# Patient Record
Sex: Female | Born: 1960 | Race: Black or African American | Hispanic: No | Marital: Married | Smoking: Never smoker
Health system: Southern US, Community
[De-identification: ages and names within clinical notes are randomized; demographics above are authoritative.]

## PROBLEM LIST (undated history)

## (undated) DIAGNOSIS — Z87440 Personal history of urinary (tract) infections: Secondary | ICD-10-CM

## (undated) DIAGNOSIS — I872 Venous insufficiency (chronic) (peripheral): Secondary | ICD-10-CM

## (undated) DIAGNOSIS — Z923 Personal history of irradiation: Secondary | ICD-10-CM

## (undated) DIAGNOSIS — Z8041 Family history of malignant neoplasm of ovary: Secondary | ICD-10-CM

## (undated) DIAGNOSIS — J329 Chronic sinusitis, unspecified: Secondary | ICD-10-CM

## (undated) DIAGNOSIS — R600 Localized edema: Secondary | ICD-10-CM

## (undated) DIAGNOSIS — G43909 Migraine, unspecified, not intractable, without status migrainosus: Secondary | ICD-10-CM

## (undated) DIAGNOSIS — C50919 Malignant neoplasm of unspecified site of unspecified female breast: Secondary | ICD-10-CM

## (undated) HISTORY — DX: Family history of malignant neoplasm of ovary: Z80.41

## (undated) HISTORY — DX: Personal history of urinary (tract) infections: Z87.440

## (undated) HISTORY — DX: Localized edema: R60.0

## (undated) HISTORY — DX: Venous insufficiency (chronic) (peripheral): I87.2

## (undated) HISTORY — DX: Chronic sinusitis, unspecified: J32.9

## (undated) HISTORY — DX: Migraine, unspecified, not intractable, without status migrainosus: G43.909

## (undated) HISTORY — DX: Malignant neoplasm of unspecified site of unspecified female breast: C50.919

## (undated) HISTORY — PX: BREAST BIOPSY: SHX20

---

## 1998-12-13 ENCOUNTER — Encounter: Admission: RE | Admit: 1998-12-13 | Discharge: 1999-01-31 | Payer: Self-pay | Admitting: Family Medicine

## 1998-12-13 ENCOUNTER — Encounter: Admission: RE | Admit: 1998-12-13 | Discharge: 1999-03-13 | Payer: Self-pay | Admitting: Family Medicine

## 2000-09-29 ENCOUNTER — Ambulatory Visit (HOSPITAL_COMMUNITY): Admission: RE | Admit: 2000-09-29 | Discharge: 2000-09-29 | Payer: Self-pay | Admitting: Family Medicine

## 2000-12-30 ENCOUNTER — Other Ambulatory Visit: Admission: RE | Admit: 2000-12-30 | Discharge: 2000-12-30 | Payer: Self-pay | Admitting: Obstetrics and Gynecology

## 2001-06-19 ENCOUNTER — Encounter: Payer: Self-pay | Admitting: Family Medicine

## 2001-06-19 ENCOUNTER — Ambulatory Visit: Admission: RE | Admit: 2001-06-19 | Discharge: 2001-06-19 | Payer: Self-pay | Admitting: Family Medicine

## 2004-05-18 ENCOUNTER — Ambulatory Visit (HOSPITAL_COMMUNITY): Admission: RE | Admit: 2004-05-18 | Discharge: 2004-05-18 | Payer: Self-pay | Admitting: Obstetrics and Gynecology

## 2006-04-04 ENCOUNTER — Ambulatory Visit (HOSPITAL_COMMUNITY): Admission: RE | Admit: 2006-04-04 | Discharge: 2006-04-04 | Payer: Self-pay | Admitting: General Surgery

## 2006-04-04 ENCOUNTER — Encounter (INDEPENDENT_AMBULATORY_CARE_PROVIDER_SITE_OTHER): Payer: Self-pay | Admitting: Specialist

## 2009-04-11 ENCOUNTER — Encounter (INDEPENDENT_AMBULATORY_CARE_PROVIDER_SITE_OTHER): Payer: Self-pay | Admitting: Obstetrics and Gynecology

## 2009-04-11 ENCOUNTER — Ambulatory Visit (HOSPITAL_COMMUNITY): Admission: RE | Admit: 2009-04-11 | Discharge: 2009-04-13 | Payer: Self-pay | Admitting: Obstetrics and Gynecology

## 2009-04-11 HISTORY — PX: ABDOMINAL HYSTERECTOMY: SHX81

## 2010-07-22 ENCOUNTER — Encounter: Payer: Self-pay | Admitting: Family Medicine

## 2010-10-04 LAB — CBC
HCT: 32.1 % — ABNORMAL LOW (ref 36.0–46.0)
HCT: 36.2 % (ref 36.0–46.0)
Hemoglobin: 12.1 g/dL (ref 12.0–15.0)
MCHC: 33.3 g/dL (ref 30.0–36.0)
MCV: 93.6 fL (ref 78.0–100.0)
MCV: 95 fL (ref 78.0–100.0)
Platelets: 229 10*3/uL (ref 150–400)
RBC: 3.43 MIL/uL — ABNORMAL LOW (ref 3.87–5.11)
RBC: 3.81 MIL/uL — ABNORMAL LOW (ref 3.87–5.11)
RDW: 12.4 % (ref 11.5–15.5)
WBC: 4.7 10*3/uL (ref 4.0–10.5)
WBC: 9.7 10*3/uL (ref 4.0–10.5)

## 2010-10-04 LAB — COMPREHENSIVE METABOLIC PANEL
ALT: 12 U/L (ref 0–35)
AST: 18 U/L (ref 0–37)
Albumin: 3.7 g/dL (ref 3.5–5.2)
Alkaline Phosphatase: 46 U/L (ref 39–117)
BUN: 4 mg/dL — ABNORMAL LOW (ref 6–23)
CO2: 27 mEq/L (ref 19–32)
Calcium: 8.8 mg/dL (ref 8.4–10.5)
Chloride: 108 mEq/L (ref 96–112)
Creatinine, Ser: 0.71 mg/dL (ref 0.4–1.2)
GFR calc Af Amer: >60 mL/min (ref >60)
GFR calc non Af Amer: >60 mL/min (ref >60)
Glucose, Bld: 90 mg/dL (ref 70–99)
Potassium: 3.6 mEq/L (ref 3.5–5.1)
Sodium: 138 mEq/L (ref 135–145)
Total Bilirubin: 0.5 mg/dL (ref 0.3–1.2)
Total Protein: 6.8 g/dL (ref 6.0–8.3)

## 2010-10-04 LAB — DIFFERENTIAL
Lymphocytes Relative: 32 % (ref 12–46)
Lymphs Abs: 1.5 10*3/uL (ref 0.7–4.0)
Monocytes Relative: 6 % (ref 3–12)
Neutro Abs: 2.8 10*3/uL (ref 1.7–7.7)
Neutrophils Relative %: 60 % (ref 43–77)

## 2010-11-16 NOTE — Op Note (Signed)
[name], Felicia Beasley              ACCOUNT NO.:  [id number]   MEDICAL RECORD NO.:  [id number]          PATIENT TYPE:  AMB   LOCATION:  DAY                          FACILITY:  [facility]   PHYSICIAN:  Timothy E. Earlene Plater, M.D. DATE OF BIRTH:  05-31-1961   DATE OF PROCEDURE:  04/04/2006  DATE OF DISCHARGE:                                 OPERATIVE REPORT   PREOPERATIVE DIAGNOSIS:  External hemorrhoids.   POSTOPERATIVE DIAGNOSIS:  External hemorrhoids.   PROCEDURE:  External hemorrhoidectomy.   SURGEON:  Timothy E. Earlene Plater, M.D.   ANESTHESIA:  General.   INDICATIONS FOR PROCEDURE:  [name] is an otherwise healthy 54, has had  long-term external hemorrhoids with pain, abrasion, burning, some bright red  bleeding and difficulty cleansing.  Negative GI history.  She had been seen,  counseled, examined and is ready for surgery.  She was seen, identified and  the permit signed.   DESCRIPTION OF PROCEDURE:  The patient was taken to the operating room and  placed supine.  General endotracheal anesthesia administered.  She was  placed in lithotomy.  Perianal area was inspected, prepped and draped in the  usual fashion.  A large complex external hemorrhoid was present posteriorly  with two internal components along the anoderm, one to the immediate right  of the posterior midline, one to the immediate left of the posterior  midline, and a confluent, single, large external tag covering roughly  between the 5 o'clock and 7 o'clock positions.  So to remove all the excess  tissue and the hemorrhoids I excised the two anodermal columns, first  radially and then excised the skin circumferentially.  I undermined the  edges, removed the superficial varicosities.  I closed each radial incision  with a radial running 3-0 chromic and then close the circumferential  incision with interrupted 3-0 chromic.Marland Kitchen  Bleeding was controlled.  The results were satisfactory.  The anus was not compromised and the  sphincter was left unmolested.  Inspection was accomplished.  Gelfoam gauze  dry sterile dressing.  Counts correct.  She tolerated it well, was removed  recovery room in good condition.   Written and verbal instructions were given her and her husband, along with  Percocet #[phone].  She will be followed as an outpatient.Sheppard Plumber. Earlene Plater, M.D.  Electronically Signed     TED/MEDQ  D:  04/04/2006  T:  04/06/2006  Job:  [id number]   cc:   Malachi Pro. Ambrose Mantle, M.D.  Fax: [phone]

## 2013-03-24 ENCOUNTER — Encounter: Payer: Self-pay | Admitting: Surgery

## 2013-03-24 ENCOUNTER — Other Ambulatory Visit: Payer: Self-pay | Admitting: *Deleted

## 2013-03-24 DIAGNOSIS — R609 Edema, unspecified: Secondary | ICD-10-CM

## 2013-04-26 ENCOUNTER — Encounter: Payer: BC Managed Care – PPO | Admitting: Surgery

## 2013-04-26 ENCOUNTER — Encounter (HOSPITAL_COMMUNITY): Payer: BC Managed Care – PPO

## 2019-01-27 ENCOUNTER — Other Ambulatory Visit: Payer: Self-pay | Admitting: Obstetrics and Gynecology

## 2019-01-27 DIAGNOSIS — N6310 Unspecified lump in the right breast, unspecified quadrant: Secondary | ICD-10-CM

## 2019-02-03 ENCOUNTER — Other Ambulatory Visit: Payer: Self-pay | Admitting: Obstetrics and Gynecology

## 2019-02-03 ENCOUNTER — Other Ambulatory Visit: Payer: Self-pay

## 2019-02-03 ENCOUNTER — Ambulatory Visit
Admission: RE | Admit: 2019-02-03 | Discharge: 2019-02-03 | Disposition: A | Discharge status: Home / Self Care | Payer: BC Managed Care – PPO | Source: Ambulatory Visit | Attending: Obstetrics and Gynecology | Admitting: Obstetrics and Gynecology

## 2019-02-03 DIAGNOSIS — N6310 Unspecified lump in the right breast, unspecified quadrant: Secondary | ICD-10-CM

## 2019-08-09 ENCOUNTER — Ambulatory Visit
Admission: RE | Admit: 2019-08-09 | Discharge: 2019-08-09 | Disposition: A | Discharge status: Home / Self Care | Payer: BC Managed Care – PPO | Source: Ambulatory Visit | Attending: Obstetrics and Gynecology | Admitting: Obstetrics and Gynecology

## 2019-08-09 ENCOUNTER — Other Ambulatory Visit: Payer: Self-pay | Admitting: Obstetrics and Gynecology

## 2019-08-09 ENCOUNTER — Other Ambulatory Visit: Payer: Self-pay

## 2019-08-09 DIAGNOSIS — N6310 Unspecified lump in the right breast, unspecified quadrant: Secondary | ICD-10-CM

## 2019-08-18 ENCOUNTER — Other Ambulatory Visit: Payer: Self-pay

## 2019-08-18 ENCOUNTER — Ambulatory Visit
Admission: RE | Admit: 2019-08-18 | Discharge: 2019-08-18 | Disposition: A | Discharge status: Home / Self Care | Payer: BC Managed Care – PPO | Source: Ambulatory Visit | Attending: Obstetrics and Gynecology | Admitting: Obstetrics and Gynecology

## 2019-08-18 DIAGNOSIS — N6310 Unspecified lump in the right breast, unspecified quadrant: Secondary | ICD-10-CM

## 2019-08-23 ENCOUNTER — Ambulatory Visit: Payer: Self-pay | Admitting: Surgery

## 2019-08-23 DIAGNOSIS — D0511 Intraductal carcinoma in situ of right breast: Secondary | ICD-10-CM

## 2019-08-23 NOTE — H&P (View-Only) (Signed)
Felicia Beasley Documented: 08/23/2019 2:35 PM Location: [facility] Patient #: [id number] DOB: [birth year] Married / Language: English / Race: Black or African American Female  History of Present Illness Marcello Moores A. Fadil Macmaster MD; 08/23/2019 3:58 PM) Patient words: Patient sent at the request of the [facility] [place] Dr. Claudie Revering for screening detected right breast mammographic abnormality. 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 There was an ultrasound correlates. She denies history of breast mass, nipple discharge or breast pain. No family history of breast cancer.                  ADDENDUM REPORT: 08/19/2019 16:04 ADDENDUM: Ultrasound of the right axilla was performed on 08/09/2019 and demonstrated no abnormal lymph nodes. Electronically Signed By: Claudie Revering M.D. On: 08/19/2019 16:04 Addended by Enrique Sack, MD on 08/19/2019 6:04 PM  Study Result CLINICAL DATA: Follow-up probably benign mass in the 10 o'clock position of the right breast. EXAM: DIGITAL DIAGNOSTIC RIGHT MAMMOGRAM WITH CAD AND TOMO ULTRASOUND RIGHT BREAST COMPARISON: Previous exam(s). ACR Breast Density Category b: There are scattered areas of fibroglandular density. FINDINGS: An oval asymmetry with ill-defined margins and some interspersed fat is again demonstrated in the posterior aspect of the outer right breast. This appears slightly more irregular today without significant change in size. There is also a suggestion of mild interval distortion associated with the asymmetry. No findings elsewhere in the right breast suspicious for malignancy. Mammographic images were processed with CAD. On physical exam, no mass is palpable in the upper outer right breast. Targeted ultrasound is performed, showing a 9 x 9 x 4 mm irregular, mildly hypoechoic area with interspersed increased  echogenicity in the 10 o'clock position of the right breast, 10 cm from the nipple, within an area of dense glandular tissue. This measured 9 x 8 x 4 mm 02/03/2019. The mildly hypoechoic areas have an echogenicity similar to adjacent fat lobules. IMPRESSION: Slightly more suspicious appearance of the mammographic asymmetry in the upper outer right breast. It is difficult to determine if the ultrasound finding in that area of the breast corresponds to the mammographic asymmetry. RECOMMENDATION: 3D stereotactic guided core needle biopsy of the mammographic asymmetry in the outer right breast. This has been discussed with the patient and scheduled at 11:30 a.m. on 08/18/2019. I have discussed the findings and recommendations with the patient. If applicable, a reminder letter will be sent to the patient regarding the next appointment. BI-RADS CATEGORY 4: Suspicious. Electronically Signed: By: Claudie Revering M.D. On: 08/09/2019 16:27                      Diagnosis Breast, right, needle core biopsy, outer - DUCTAL CARCINOMA IN SITU - SEE COMMENT.  The patient is a 59 year old female.   Past Surgical History Andreas Blower, [place]; 08/23/2019 2:40 PM) Breast Biopsy Right. Cesarean Section - 1 Hemorrhoidectomy Hysterectomy (not due to cancer) - Complete  Diagnostic Studies History (Armen Ferguson, CMA; 08/23/2019 2:40 PM) Colonoscopy 5-10 years ago Mammogram within last year Pap Smear 1-5 years ago  Allergies Andreas Blower, [place]; 08/23/2019 2:41 PM) Macrobid *URINARY ANTI-INFECTIVES*  Medication History Andreas Blower, [place]; 08/23/2019 2:41 PM) No Current Medications Medications Reconciled  Social History Andreas Blower, CMA; 08/23/2019 2:40 PM) Caffeine use Coffee. No alcohol use No drug use Tobacco use Never smoker.  Family History Andreas Blower, [place]; 08/23/2019 2:40 PM) Arthritis Father, Mother.  Diabetes Mellitus Mother. Ovarian Cancer  Sister.  Pregnancy / Birth History Andreas Blower, [place]; 08/23/2019 2:40 PM) Age at menarche 30 years. Age of menopause 44-55 Gravida 16 Maternal age 42-25 Para 3  Other Problems Andreas Blower, [place]; 08/23/2019 2:40 PM) Hemorrhoids Oophorectomy     Review of Systems (Armen Ferguson CMA; 08/23/2019 2:40 PM) General Not Present- Appetite Loss, Chills, Fatigue, Fever, Night Sweats, Weight Gain and Weight Loss. Skin Not Present- Change in Wart/Mole, Dryness, Hives, Jaundice, New Lesions, Non-Healing Wounds, Rash and Ulcer. HEENT Not Present- Earache, Hearing Loss, Hoarseness, Nose Bleed, Oral Ulcers, Ringing in the Ears, Seasonal Allergies, Sinus Pain, Sore Throat, Visual Disturbances, Wears glasses/contact lenses and Yellow Eyes. Respiratory Not Present- Bloody sputum, Chronic Cough, Difficulty Breathing, Snoring and Wheezing. Cardiovascular Not Present- Chest Pain, Difficulty Breathing Lying Down, Leg Cramps, Palpitations, Rapid Heart Rate, Shortness of Breath and Swelling of Extremities. Gastrointestinal Not Present- Abdominal Pain, Bloating, Bloody Stool, Change in Bowel Habits, Chronic diarrhea, Constipation, Difficulty Swallowing, Excessive gas, Gets full quickly at meals, Hemorrhoids, Indigestion, Nausea, Rectal Pain and Vomiting. Female Genitourinary Not Present- Frequency, Nocturia, Painful Urination, Pelvic Pain and Urgency. Neurological Not Present- Decreased Memory, Fainting, Headaches, Numbness, Seizures, Tingling, Tremor, Trouble walking and Weakness. Psychiatric Not Present- Anxiety, Bipolar, Change in Sleep Pattern, Depression, Fearful and Frequent crying. Endocrine Not Present- Cold Intolerance, Excessive Hunger, Hair Changes, Heat Intolerance, Hot flashes and New Diabetes. Hematology Not Present- Blood Thinners, Easy Bruising, Excessive bleeding, Gland problems, HIV and Persistent Infections.  Vitals (Armen Ferguson CMA; 08/23/2019 2:41 PM) 08/23/2019 2:41 PM Weight:  215.38 lb Height: 66in Body Surface Area: 2.06 m Body Mass Index: 34.76 kg/m  Temp.: 98.58F  Pulse: 84 (Regular)  P.OX: 98% (Room air) BP: 132/86 (Sitting, Left Arm, Standard)        Physical Exam (Kamry Faraci A. Tesa Meadors MD; 08/23/2019 3:58 PM)  General Mental Status-Alert. General Appearance-Consistent with stated age. Hydration-Well hydrated. Voice-Normal.  Chest and Lung Exam Note: cta NO sob  Breast Breast - Left-Symmetric, Non Tender, No Biopsy scars, no Dimpling - Left, No Inflammation, No Lumpectomy scars, No Mastectomy scars, No Peau d' Orange. Breast - Right-Symmetric, Non Tender, No Biopsy scars, no Dimpling - Right, No Inflammation, No Lumpectomy scars, No Mastectomy scars, No Peau d' Orange. Breast Lump-No Palpable Breast Mass. Note: Mild bruising right breast upper outer quadrant  Cardiovascular Note: NSR no JVD  Neurologic Neurologic evaluation reveals -alert and oriented x 3 with no impairment of recent or remote memory. Mental Status-Normal.  Lymphatic Head & Neck  General Head & Neck Lymphatics: Bilateral - Description - Normal. Axillary  General Axillary Region: Bilateral - Description - Normal. Tenderness - Non Tender.    Assessment & Plan (Sahra Converse A. Mckaila Duffus MD; 08/23/2019 4:03 PM)  BREAST NEOPLASM, TIS (DCIS), RIGHT (D05.11) Impression: Discussed breast conserving surgery versus mastectomy with reconstruction. Discussed the role of lymph node mapping which for DCIS is necessary at this point unless invasive disease is encountered. She has opted for right breast lumpectomy. Refer to medical and radiation oncology. Risk of lumpectomy include bleeding, infection, seroma, more surgery, use of seed/wire, wound care, cosmetic deformity and the need for other treatments, death , blood clots, death. Pt agrees to proceed.   TOTAL TIME 24 MINUTES  Face-to-face, Dr. dictation, examination, chart review, review of mammogram,  review pathology and documentation  Current Plans You are being scheduled for surgery- Our schedulers will call you.  You should hear from our office's scheduling department within 5 working days about the location,  date, and time of surgery. We try to make accommodations for patient's preferences in scheduling surgery, but sometimes the OR schedule or the surgeon's schedule prevents [place] from making those accommodations.  If you have not heard from our office [phone]) in 5 working days, call the office and ask for your surgeon's nurse.  If you have other questions about your diagnosis, plan, or surgery, call the office and ask for your surgeon's nurse.  We discussed the staging and pathophysiology of breast cancer. We discussed all of the different options for treatment for breast cancer including surgery, chemotherapy, radiation therapy, Herceptin, and antiestrogen therapy. We discussed a sentinel lymph node biopsy as she does not appear to having lymph node involvement right now. We discussed the performance of that with injection of radioactive tracer and blue dye. We discussed that she would have an incision underneath her axillary hairline. We discussed that there is a bout a 10-20% chance of having a positive node with a sentinel lymph node biopsy and we will await the permanent pathology to make any other first further decisions in terms of her treatment. One of these options might be to return to the operating room to perform an axillary lymph node dissection. We discussed about a 1-2% risk lifetime of chronic shoulder pain as well as lymphedema associated with a sentinel lymph node biopsy. We discussed the options for treatment of the breast cancer which included lumpectomy versus a mastectomy. We discussed the performance of the lumpectomy with a wire placement. We discussed a 10-20% chance of a positive margin requiring reexcision in the operating room. We also discussed that she may  need radiation therapy or antiestrogen therapy or both if she undergoes lumpectomy. We discussed the mastectomy and the postoperative care for that as well. We discussed that there is no difference in her survival whether she undergoes lumpectomy with radiation therapy or antiestrogen therapy versus a mastectomy. There is a slight difference in the local recurrence rate being 3-5% with lumpectomy and about 1% with a mastectomy. We discussed the risks of operation including bleeding, infection, possible reoperation. She understands her further therapy will be based on what her stages at the time of her operation.  Pt Education - [facility] breast cancer surgery: discussed with patient and provided information. Pt Education - [facility] Breast Biopsy HCI: discussed with patient and provided information. Pt Education - [facility] Breast Pains Education

## 2019-08-23 NOTE — H&P (Signed)
Nolon Nations Documented: 08/23/2019 2:35 PM Location: [facility] Patient #: [id number] DOB: 04-Nov-1960 Married / Language: English / Race: Black or African American Female  History of Present Illness Marcello Moores A. Lizzie Cokley MD; 08/23/2019 3:58 PM) Patient words: Patient sent at the request of the [facility] [place] Dr. Claudie Revering for screening detected right breast mammographic abnormality. 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 There was an ultrasound correlates. She denies history of breast mass, nipple discharge or breast pain. No family history of breast cancer.                  ADDENDUM REPORT: 08/19/2019 16:04 ADDENDUM: Ultrasound of the right axilla was performed on 08/09/2019 and demonstrated no abnormal lymph nodes. Electronically Signed By: Claudie Revering M.D. On: 08/19/2019 16:04 Addended by Enrique Sack, MD on 08/19/2019 6:04 PM  Study Result CLINICAL DATA: Follow-up probably benign mass in the 10 o'clock position of the right breast. EXAM: DIGITAL DIAGNOSTIC RIGHT MAMMOGRAM WITH CAD AND TOMO ULTRASOUND RIGHT BREAST COMPARISON: Previous exam(s). ACR Breast Density Category b: There are scattered areas of fibroglandular density. FINDINGS: An oval asymmetry with ill-defined margins and some interspersed fat is again demonstrated in the posterior aspect of the outer right breast. This appears slightly more irregular today without significant change in size. There is also a suggestion of mild interval distortion associated with the asymmetry. No findings elsewhere in the right breast suspicious for malignancy. Mammographic images were processed with CAD. On physical exam, no mass is palpable in the upper outer right breast. Targeted ultrasound is performed, showing a 9 x 9 x 4 mm irregular, mildly hypoechoic area with interspersed increased  echogenicity in the 10 o'clock position of the right breast, 10 cm from the nipple, within an area of dense glandular tissue. This measured 9 x 8 x 4 mm 02/03/2019. The mildly hypoechoic areas have an echogenicity similar to adjacent fat lobules. IMPRESSION: Slightly more suspicious appearance of the mammographic asymmetry in the upper outer right breast. It is difficult to determine if the ultrasound finding in that area of the breast corresponds to the mammographic asymmetry. RECOMMENDATION: 3D stereotactic guided core needle biopsy of the mammographic asymmetry in the outer right breast. This has been discussed with the patient and scheduled at 11:30 a.m. on 08/18/2019. I have discussed the findings and recommendations with the patient. If applicable, a reminder letter will be sent to the patient regarding the next appointment. BI-RADS CATEGORY 4: Suspicious. Electronically Signed: By: Claudie Revering M.D. On: 08/09/2019 16:27                      Diagnosis Breast, right, needle core biopsy, outer - DUCTAL CARCINOMA IN SITU - SEE COMMENT.  The patient is a 59 year old female.   Past Surgical History Andreas Blower, [place]; 08/23/2019 2:40 PM) Breast Biopsy Right. Cesarean Section - 1 Hemorrhoidectomy Hysterectomy (not due to cancer) - Complete  Diagnostic Studies History (Armen Ferguson, CMA; 08/23/2019 2:40 PM) Colonoscopy 5-10 years ago Mammogram within last year Pap Smear 1-5 years ago  Allergies Andreas Blower, [place]; 08/23/2019 2:41 PM) Macrobid *URINARY ANTI-INFECTIVES*  Medication History Andreas Blower, [place]; 08/23/2019 2:41 PM) No Current Medications Medications Reconciled  Social History Andreas Blower, CMA; 08/23/2019 2:40 PM) Caffeine use Coffee. No alcohol use No drug use Tobacco use Never smoker.  Family History Andreas Blower, [place]; 08/23/2019 2:40 PM) Arthritis Father, Mother.  Diabetes Mellitus Mother. Ovarian Cancer  Sister.  Pregnancy / Birth History Andreas Blower, [place]; 08/23/2019 2:40 PM) Age at menarche 36 years. Age of menopause 63-55 Gravida 96 Maternal age 68-25 Para 3  Other Problems Andreas Blower, [place]; 08/23/2019 2:40 PM) Hemorrhoids Oophorectomy     Review of Systems (Armen Ferguson CMA; 08/23/2019 2:40 PM) General Not Present- Appetite Loss, Chills, Fatigue, Fever, Night Sweats, Weight Gain and Weight Loss. Skin Not Present- Change in Wart/Mole, Dryness, Hives, Jaundice, New Lesions, Non-Healing Wounds, Rash and Ulcer. HEENT Not Present- Earache, Hearing Loss, Hoarseness, Nose Bleed, Oral Ulcers, Ringing in the Ears, Seasonal Allergies, Sinus Pain, Sore Throat, Visual Disturbances, Wears glasses/contact lenses and Yellow Eyes. Respiratory Not Present- Bloody sputum, Chronic Cough, Difficulty Breathing, Snoring and Wheezing. Cardiovascular Not Present- Chest Pain, Difficulty Breathing Lying Down, Leg Cramps, Palpitations, Rapid Heart Rate, Shortness of Breath and Swelling of Extremities. Gastrointestinal Not Present- Abdominal Pain, Bloating, Bloody Stool, Change in Bowel Habits, Chronic diarrhea, Constipation, Difficulty Swallowing, Excessive gas, Gets full quickly at meals, Hemorrhoids, Indigestion, Nausea, Rectal Pain and Vomiting. Female Genitourinary Not Present- Frequency, Nocturia, Painful Urination, Pelvic Pain and Urgency. Neurological Not Present- Decreased Memory, Fainting, Headaches, Numbness, Seizures, Tingling, Tremor, Trouble walking and Weakness. Psychiatric Not Present- Anxiety, Bipolar, Change in Sleep Pattern, Depression, Fearful and Frequent crying. Endocrine Not Present- Cold Intolerance, Excessive Hunger, Hair Changes, Heat Intolerance, Hot flashes and New Diabetes. Hematology Not Present- Blood Thinners, Easy Bruising, Excessive bleeding, Gland problems, HIV and Persistent Infections.  Vitals (Armen Ferguson CMA; 08/23/2019 2:41 PM) 08/23/2019 2:41 PM Weight:  215.38 lb Height: 66in Body Surface Area: 2.06 m Body Mass Index: 34.76 kg/m  Temp.: 98.41F  Pulse: 84 (Regular)  P.OX: 98% (Room air) BP: 132/86 (Sitting, Left Arm, Standard)        Physical Exam (Jasraj Lappe A. Mikahla Wisor MD; 08/23/2019 3:58 PM)  General Mental Status-Alert. General Appearance-Consistent with stated age. Hydration-Well hydrated. Voice-Normal.  Chest and Lung Exam Note: cta NO sob  Breast Breast - Left-Symmetric, Non Tender, No Biopsy scars, no Dimpling - Left, No Inflammation, No Lumpectomy scars, No Mastectomy scars, No Peau d' Orange. Breast - Right-Symmetric, Non Tender, No Biopsy scars, no Dimpling - Right, No Inflammation, No Lumpectomy scars, No Mastectomy scars, No Peau d' Orange. Breast Lump-No Palpable Breast Mass. Note: Mild bruising right breast upper outer quadrant  Cardiovascular Note: NSR no JVD  Neurologic Neurologic evaluation reveals -alert and oriented x 3 with no impairment of recent or remote memory. Mental Status-Normal.  Lymphatic Head & Neck  General Head & Neck Lymphatics: Bilateral - Description - Normal. Axillary  General Axillary Region: Bilateral - Description - Normal. Tenderness - Non Tender.    Assessment & Plan (Terrell Ostrand A. Kendrell Lottman MD; 08/23/2019 4:03 PM)  BREAST NEOPLASM, TIS (DCIS), RIGHT (D05.11) Impression: Discussed breast conserving surgery versus mastectomy with reconstruction. Discussed the role of lymph node mapping which for DCIS is necessary at this point unless invasive disease is encountered. She has opted for right breast lumpectomy. Refer to medical and radiation oncology. Risk of lumpectomy include bleeding, infection, seroma, more surgery, use of seed/wire, wound care, cosmetic deformity and the need for other treatments, death , blood clots, death. Pt agrees to proceed.   TOTAL TIME 93 MINUTES  Face-to-face, Dr. dictation, examination, chart review, review of mammogram,  review pathology and documentation  Current Plans You are being scheduled for surgery- Our schedulers will call you.  You should hear from our office's scheduling department within 5 working days about the location,  date, and time of surgery. We try to make accommodations for patient's preferences in scheduling surgery, but sometimes the OR schedule or the surgeon's schedule prevents [place] from making those accommodations.  If you have not heard from our office [phone]) in 5 working days, call the office and ask for your surgeon's nurse.  If you have other questions about your diagnosis, plan, or surgery, call the office and ask for your surgeon's nurse.  We discussed the staging and pathophysiology of breast cancer. We discussed all of the different options for treatment for breast cancer including surgery, chemotherapy, radiation therapy, Herceptin, and antiestrogen therapy. We discussed a sentinel lymph node biopsy as she does not appear to having lymph node involvement right now. We discussed the performance of that with injection of radioactive tracer and blue dye. We discussed that she would have an incision underneath her axillary hairline. We discussed that there is a bout a 10-20% chance of having a positive node with a sentinel lymph node biopsy and we will await the permanent pathology to make any other first further decisions in terms of her treatment. One of these options might be to return to the operating room to perform an axillary lymph node dissection. We discussed about a 1-2% risk lifetime of chronic shoulder pain as well as lymphedema associated with a sentinel lymph node biopsy. We discussed the options for treatment of the breast cancer which included lumpectomy versus a mastectomy. We discussed the performance of the lumpectomy with a wire placement. We discussed a 10-20% chance of a positive margin requiring reexcision in the operating room. We also discussed that she may  need radiation therapy or antiestrogen therapy or both if she undergoes lumpectomy. We discussed the mastectomy and the postoperative care for that as well. We discussed that there is no difference in her survival whether she undergoes lumpectomy with radiation therapy or antiestrogen therapy versus a mastectomy. There is a slight difference in the local recurrence rate being 3-5% with lumpectomy and about 1% with a mastectomy. We discussed the risks of operation including bleeding, infection, possible reoperation. She understands her further therapy will be based on what her stages at the time of her operation.  Pt Education - [facility]b breast cancer surgery: discussed with patient and provided information. Pt Education - [facility] Breast Biopsy HCI: discussed with patient and provided information. Pt Education - [facility] Breast Pains Education

## 2019-08-24 ENCOUNTER — Telehealth: Payer: Self-pay | Admitting: Oncology

## 2019-08-24 NOTE — Telephone Encounter (Signed)
Received a new pt referral from Dr. Brantley Stage at [facility] for a dx of breast cancer. Pt has been cld and scheduled to see Dr. Jana Hakim on 3/2 at 4pm w/labs at 3:30pm. Pt aware to arrive 15 minutes early.

## 2019-08-25 ENCOUNTER — Encounter: Payer: Self-pay | Admitting: *Deleted

## 2019-08-25 ENCOUNTER — Encounter: Payer: Self-pay | Admitting: Adult Health

## 2019-08-25 DIAGNOSIS — Z170 Estrogen receptor positive status [ER+]: Secondary | ICD-10-CM | POA: Insufficient documentation

## 2019-08-25 DIAGNOSIS — C50411 Malignant neoplasm of upper-outer quadrant of right female breast: Secondary | ICD-10-CM | POA: Insufficient documentation

## 2019-08-26 ENCOUNTER — Other Ambulatory Visit: Payer: Self-pay | Admitting: Surgery

## 2019-08-26 DIAGNOSIS — D0511 Intraductal carcinoma in situ of right breast: Secondary | ICD-10-CM

## 2019-08-30 ENCOUNTER — Encounter: Payer: Self-pay | Admitting: Oncology

## 2019-08-30 ENCOUNTER — Other Ambulatory Visit: Payer: Self-pay

## 2019-08-30 DIAGNOSIS — Z170 Estrogen receptor positive status [ER+]: Secondary | ICD-10-CM

## 2019-08-30 DIAGNOSIS — C50411 Malignant neoplasm of upper-outer quadrant of right female breast: Secondary | ICD-10-CM

## 2019-08-30 NOTE — Progress Notes (Signed)
[place]  Telephone:(336) [phone] Fax:(336) [phone]     ID: Felicia Beasley DOB: [birth year]  MR#: [id number]  VEH#:[id number]  Patient Care Team: Harlan Stains, MD as PCP - General (Family Medicine) Mauro Kaufmann, RN as Oncology Nurse Navigator Rockwell Germany, RN as Oncology Nurse Navigator Erroll Luna, MD as Consulting Physician (General Surgery) Idrissa Beville, Virgie Dad, MD as Consulting Physician (Oncology) Newton Pigg, MD as Consulting Physician (Obstetrics and Gynecology) Gery Pray, MD as Consulting Physician (Radiation Oncology) Clarene Essex, Counselor (Genetic Counselor) Chauncey Cruel, MD OTHER MD:  CHIEF COMPLAINT: noninvasive breast cancer  CURRENT TREATMENT: awaiting definitive surgery   HISTORY OF CURRENT ILLNESS: Felicia Beasley had routine screening mammography on 01/25/2019 showing a possible abnormality in the right breast. She underwent right diagnostic mammography with tomography and right breast ultrasonography at [facility] on 02/03/2019 showing: breast density category B; probably benign 0.9 cm mass at 10 o'clock. Repeat study was recommended in 6 months.   She returned on 08/09/2019 for repeat right diagnostic mammogram and right breast ultrasound showing: breast density category B; slightly more suspicious appearance of 0.9 cm mass at 10 o'clock, difficult to determine if this corresponds to mammographic asymmetry.  Accordingly on 08/18/2019 she proceeded to biopsy of the right breast area in question. The pathology from this procedure ([id number]) showed: ductal carcinoma in situ, intermediate grade, with necrosis. Prognostic indicators significant for: estrogen receptor, 100% positive with strong staining intensity and progesterone receptor, 100% positive with moderate staining intensity.  She is scheduled for right lumpectomy on 09/08/2019 under Dr. Brantley Stage.  The patient's subsequent history is as detailed  below.   INTERVAL HISTORY: Felicia Beasley was evaluated in the breast cancer clinic on 08/31/2019. Her case was also presented at the multidisciplinary breast cancer conference on 08/25/2019. At that time a preliminary plan was proposed: Breast conserving surgery, adjuvant radiation, antiestrogens.   REVIEW OF SYSTEMS: There were no specific symptoms leading to the original mammogram, which was routinely scheduled. The patient denies unusual headaches, visual changes, nausea, vomiting, stiff neck, dizziness, or gait imbalance. There has been no cough, phlegm production, or pleurisy, no chest pain or pressure, and no change in bowel or bladder habits. The patient denies fever, rash, bleeding, unexplained fatigue or unexplained weight loss. A detailed review of systems was otherwise entirely negative.   PAST MEDICAL HISTORY: Past Medical History:  Diagnosis Date  . Breast cancer ([place])   . Edema leg   . History of recurrent UTIs   . Migraine headache   . Sinusitis   . Venous insufficiency     PAST SURGICAL HISTORY: Past Surgical History:  Procedure Laterality Date  . ABDOMINAL HYSTERECTOMY Bilateral 04/11/2009   w/ BSO; for menorrhagia and fibroids  . CESAREAN SECTION      FAMILY HISTORY: Family History  Problem Relation Age of Onset  . Arthritis Mother   . Diabetes Mother   . Arthritis Father   . Ovarian cancer Sister    [name] is 10 years old and her mother is 17 years old as of March 2021.  The patient has 2 brothers and 2 sisters, all half siblings.  One sister had ovarian cancer diagnosed at age 27.  There is no other history of cancer in the family to the patient's knowledge  GYNECOLOGIC HISTORY:  Patient's last menstrual period was 04/11/2009 (exact date). Menarche: 59 years old Age at first live birth: 59 years old [place] P 3 LMP 2010 Contraceptive more than  1 year, no complications HRT no  Hysterectomy? Yes, for menorrhagia and fibroids BSO? 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  Her husband running manages a [employer] location and is Company secretary of the [employer] which the patient attends.  Daughter Janett Billow 43 in [place] works for the [employer] son Corene Cornea 61 in [place] works for [employer] and is very much involved in Geologist, engineering.  Daughter Anderson Malta 83 in [place] is a homemaker.  The patient has 2 grandchildren and a third 1 due August 2021.    ADVANCED DIRECTIVES: In the absence of documents to the contrary the patient's husband is her healthcare power of attorney   HEALTH MAINTENANCE: Social History   Tobacco Use  . Smoking status: Never Smoker  . Smokeless tobacco: Never Used  Substance Use Topics  . Alcohol use: No  . Drug use: No     Colonoscopy: 2010/[place]  PAP: Per Dr. Ulanda Edison  Bone density: none on file   Allergies  Allergen Reactions  . Ciprocinonide [Fluocinolone]     nausea  . Macrobid [Nitrofurantoin Macrocrystal]     migraine    Current Outpatient Medications  Medication Sig Dispense Refill  . Ascorbic Acid (VITAMIN C) 100 MG tablet Take 100 mg by mouth daily.    Marland Kitchen aspirin EC 81 MG tablet Take 81 mg by mouth daily.    . Cranberry 1000 MG CAPS Take by mouth.    . Turmeric 400 MG CAPS Take by mouth.    . Omega-3 Fatty Acids (FISH OIL) 1000 MG CAPS Fish Oil     No current facility-administered medications for this visit.    OBJECTIVE: Middle-aged African-American woman in no acute distress  Vitals:   08/31/19 1545  BP: 135/76  Pulse: 74  Resp: 18  Temp: 98.3 F (36.8 C)  SpO2: 100%     Body mass index is 34.14 kg/m.   Wt Readings from Last 3 Encounters:  08/31/19 211 lb 8 oz (95.9 kg)      ECOG FS:1 - Symptomatic but completely ambulatory  Ocular: Sclerae unicteric, pupils round and equal Ear-nose-throat: Wearing a mask Lymphatic: No cervical or supraclavicular adenopathy Lungs no rales or rhonchi Heart  regular rate and rhythm Abd soft, nontender, positive bowel sounds MSK no focal spinal tenderness, no joint edema Neuro: non-focal, well-oriented, appropriate affect Breasts: The right breast is status post recent biopsy.  There is no palpable mass and no skin or nipple changes of concern.  The left breast is benign.  Both axillae are benign.   LAB RESULTS:  CMP     Component Value Date/Time   NA 142 08/31/2019 1526   K 4.0 08/31/2019 1526   CL 106 08/31/2019 1526   CO2 28 08/31/2019 1526   GLUCOSE 92 08/31/2019 1526   BUN 13 08/31/2019 1526   CREATININE 0.87 08/31/2019 1526   CALCIUM 8.9 08/31/2019 1526   PROT 7.8 08/31/2019 1526   ALBUMIN 3.9 08/31/2019 1526   AST 14 (L) 08/31/2019 1526   ALT 13 08/31/2019 1526   ALKPHOS 71 08/31/2019 1526   BILITOT 0.2 (L) 08/31/2019 1526   GFRNONAA >60 08/31/2019 1526   GFRAA >60 08/31/2019 1526    No results found for: TOTALPROTELP, ALBUMINELP, A1GS, A2GS, BETS, BETA2SER, GAMS, MSPIKE, SPEI  Lab Results  Component Value Date   WBC 8.0 08/31/2019   NEUTROABS 4.9 08/31/2019   HGB 12.1 08/31/2019   HCT 38.4 08/31/2019  MCV 93.2 08/31/2019   PLT 257 08/31/2019    No results found for: LABCA2  No components found for: TKPTWS568  No results for input(s): INR in the last 168 hours.  No results found for: LABCA2  No results found for: LEX517  No results found for: GYF749  No results found for: SWH675  No results found for: CA2729  No components found for: HGQUANT  No results found for: CEA1 / No results found for: CEA1   No results found for: AFPTUMOR  No results found for: CHROMOGRNA  No results found for: KPAFRELGTCHN, LAMBDASER, KAPLAMBRATIO (kappa/lambda light chains)  No results found for: HGBA, HGBA2QUANT, HGBFQUANT, HGBSQUAN (Hemoglobinopathy evaluation)   No results found for: LDH  No results found for: IRON, TIBC, IRONPCTSAT (Iron and TIBC)  No results found for: FERRITIN  Urinalysis No results  found for: COLORURINE, APPEARANCEUR, LABSPEC, PHURINE, GLUCOSEU, HGBUR, BILIRUBINUR, KETONESUR, PROTEINUR, UROBILINOGEN, NITRITE, LEUKOCYTESUR   STUDIES: US BREAST LTD UNI RIGHT INC AXILLA  Addendum Date: 08/19/2019   ADDENDUM REPORT: 08/19/2019 16:04 ADDENDUM: Ultrasound of the right axilla was performed on 08/09/2019 and demonstrated no abnormal lymph nodes. Electronically Signed   By: Claudie Revering M.D.   On: 08/19/2019 16:04   Result Date: 08/19/2019 CLINICAL DATA:  Follow-up probably benign mass in the 10 o'clock position of the right breast. EXAM: DIGITAL DIAGNOSTIC RIGHT MAMMOGRAM WITH CAD AND TOMO ULTRASOUND RIGHT BREAST COMPARISON:  Previous exam(s). ACR Breast Density Category b: There are scattered areas of fibroglandular density. FINDINGS: An oval asymmetry with ill-defined margins and some interspersed fat is again demonstrated in the posterior aspect of the outer right breast. This appears slightly more irregular today without significant change in size. There is also a suggestion of mild interval distortion associated with the asymmetry. No findings elsewhere in the right breast suspicious for malignancy. Mammographic images were processed with CAD. On physical exam, no mass is palpable in the upper outer right breast. Targeted ultrasound is performed, showing a 9 x 9 x 4 mm irregular, mildly hypoechoic area with interspersed increased echogenicity in the 10 o'clock position of the right breast, 10 cm from the nipple, within an area of dense glandular tissue. This measured 9 x 8 x 4 mm 02/03/2019. The mildly hypoechoic areas have an echogenicity similar to adjacent fat lobules. IMPRESSION: Slightly more suspicious appearance of the mammographic asymmetry in the upper outer right breast. It is difficult to determine if the ultrasound finding in that area of the breast corresponds to the mammographic asymmetry. RECOMMENDATION: 3D stereotactic guided core needle biopsy of the mammographic  asymmetry in the outer right breast. This has been discussed with the patient and scheduled at 11:30 a.m. on 08/18/2019. I have discussed the findings and recommendations with the patient. If applicable, a reminder letter will be sent to the patient regarding the next appointment. BI-RADS CATEGORY  4: Suspicious. Electronically Signed: By: Claudie Revering M.D. On: 08/09/2019 16:27   MM DIAG BREAST TOMO UNI RIGHT  Addendum Date: 08/19/2019   ADDENDUM REPORT: 08/19/2019 16:04 ADDENDUM: Ultrasound of the right axilla was performed on 08/09/2019 and demonstrated no abnormal lymph nodes. Electronically Signed   By: Claudie Revering M.D.   On: 08/19/2019 16:04   Result Date: 08/19/2019 CLINICAL DATA:  Follow-up probably benign mass in the 10 o'clock position of the right breast. EXAM: DIGITAL DIAGNOSTIC RIGHT MAMMOGRAM WITH CAD AND TOMO ULTRASOUND RIGHT BREAST COMPARISON:  Previous exam(s). ACR Breast Density Category b: There are scattered areas of fibroglandular density. FINDINGS:  An oval asymmetry with ill-defined margins and some interspersed fat is again demonstrated in the posterior aspect of the outer right breast. This appears slightly more irregular today without significant change in size. There is also a suggestion of mild interval distortion associated with the asymmetry. No findings elsewhere in the right breast suspicious for malignancy. Mammographic images were processed with CAD. On physical exam, no mass is palpable in the upper outer right breast. Targeted ultrasound is performed, showing a 9 x 9 x 4 mm irregular, mildly hypoechoic area with interspersed increased echogenicity in the 10 o'clock position of the right breast, 10 cm from the nipple, within an area of dense glandular tissue. This measured 9 x 8 x 4 mm 02/03/2019. The mildly hypoechoic areas have an echogenicity similar to adjacent fat lobules. IMPRESSION: Slightly more suspicious appearance of the mammographic asymmetry in the upper outer  right breast. It is difficult to determine if the ultrasound finding in that area of the breast corresponds to the mammographic asymmetry. RECOMMENDATION: 3D stereotactic guided core needle biopsy of the mammographic asymmetry in the outer right breast. This has been discussed with the patient and scheduled at 11:30 a.m. on 08/18/2019. I have discussed the findings and recommendations with the patient. If applicable, a reminder letter will be sent to the patient regarding the next appointment. BI-RADS CATEGORY  4: Suspicious. Electronically Signed: By: Claudie Revering M.D. On: 08/09/2019 16:27   MM CLIP PLACEMENT RIGHT  Result Date: 08/18/2019 CLINICAL DATA:  Stereotactic biopsy was performed of an asymmetry in the upper outer right breast. EXAM: DIAGNOSTIC RIGHT MAMMOGRAM POST STEREOTACTIC BIOPSY COMPARISON:  Previous exam(s). FINDINGS: Mammographic images were obtained following stereotactic guided biopsy of the right breast. The biopsy marking clip is in expected position at the site of biopsy. IMPRESSION: Appropriate positioning of the coil shaped biopsy marking clip at the site of biopsy in the upper outer quadrant, posterior third. Final Assessment: Post Procedure Mammograms for Marker Placement Electronically Signed   By: Curlene Dolphin M.D.   On: 08/18/2019 12:06   MM RT BREAST BX W LOC DEV 1ST LESION IMAGE BX SPEC STEREO GUIDE  Addendum Date: 08/19/2019   ADDENDUM REPORT: 08/19/2019 13:43 ADDENDUM: Pathology revealed INTERMEDIATE GRADE DUCTAL CARCINOMA IN SITU WITH NECROSIS of the Right breast, outer. This was found to be concordant by Dr. Curlene Dolphin. Pathology results were discussed with the patient by telephone. The patient reported doing well after the biopsy with tenderness at the site. Post biopsy instructions and care were reviewed and questions were answered. The patient was encouraged to call [facility] for any additional concerns. Surgical consultation has been  arranged with Dr. Erroll Luna at [facility] on August 23, 2019. Pathology results reported by Terie Purser, RN on 08/19/2019. Electronically Signed   By: Curlene Dolphin M.D.   On: 08/19/2019 13:43   Result Date: 08/19/2019 CLINICAL DATA:  Stereotactic biopsy was recommended of an asymmetry in the outer right breast. EXAM: RIGHT BREAST STEREOTACTIC CORE NEEDLE BIOPSY COMPARISON:  Previous exams. FINDINGS: The patient and I discussed the procedure of stereotactic-guided biopsy including benefits and alternatives. We discussed the high likelihood of a successful procedure. We discussed the risks of the procedure including infection, bleeding, tissue injury, clip migration, and inadequate sampling. Informed written consent was given. The usual time out protocol was performed immediately prior to the procedure. Using sterile technique and 1% Lidocaine as local anesthetic, under stereotactic guidance, a 9 gauge vacuum assisted device was  used to perform core needle biopsy of asymmetry in the outer right breast using a superior approach. Specimen radiograph was performed showing tissue. Lesion quadrant: Upper outer quadrant At the conclusion of the procedure, coil tissue marker clip was deployed into the biopsy cavity. Follow-up 2-view mammogram was performed and dictated separately. IMPRESSION: Stereotactic-guided biopsy of the right breast. No apparent complications. Electronically Signed: By: Curlene Dolphin M.D. On: 08/18/2019 12:07     ELIGIBLE FOR AVAILABLE RESEARCH PROTOCOL: not a COMET candidate (mass on imaging)  ASSESSMENT: 59 y.o. [place] woman status post right breast upper outer quadrant biopsy 08/18/2019 showing ductal carcinoma in situ, grade 2, estrogen and progesterone receptor positive.  (1) definitive surgery pending  (2) adjuvant radiation as appropriate  (3) genetics testing  (4) antiestrogens   PLAN: I met today with Felicia Beasley to review her new  diagnosis. Specifically we discussed the biology of her breast cancer, its diagnosis, staging, treatment  options and prognosis. Felicia Beasley understands that in noninvasive ductal carcinoma, also called ductal carcinoma in situ ("DCIS") the breast cancer cells remain trapped in the ducts were they started. They cannot travel to a vital organ. For that reason these cancers in themselves are not life-threatening.  If the whole breast is removed then all the ducts are removed and since the cancer cells are trapped in the ducts, the cure rate with mastectomy for noninvasive breast cancer is approximately 99%. Nevertheless we recommend lumpectomy, because there is no survival advantage to mastectomy and because the cosmetic result is generally superior with breast conservation.  Since the patient is keeping her breasts, there will be some risk of recurrence. The recurrence can only be in the same breast since, again, the cells are trapped in the ducts. There is no connection from one breast to the other. The risk of local recurrence is cut by more than half with radiation, which is standard in this situation.  In estrogen receptor positive cancers like Felicia Beasley's, anti-estrogens can also be considered. They will further reduce the risk of recurrence by one half. In addition anti-estrogens will lower the risk of a new breast cancer developing in either breast, also by one half. That risk otherwise approaches 1% per year.   Felicia Beasley does qualify for genetics testing. In patients who carry a deleterious mutation [for example in a  BRCA gene], the risk of a new breast cancer developing in the future may be sufficiently great that the patient may choose bilateral mastectomies. However if she wishes to keep her breasts in that situation it is safe to do so. That would require intensified screening, which generally means not only yearly mammography but a yearly breast MRI as well.  She will be contacted next week by our genetics  counselors for testing.  Felicia Beasley has a good understanding of the overall plan. She agrees with it. She knows the goal of treatment in her case is cure. She will call with any problems that may develop before her next visit here.  Total encounter time 50 minutes.Chauncey Cruel, MD   08/31/2019 5:05 PM Medical Oncology and Hematology [facility] [place], [place] [address] Tel. [phone]    Fax. [phone]   This document serves as a record of services personally performed by Lurline Del, MD. It was created on his behalf by Wilburn Mylar, a trained medical scribe. The creation of this record is based on the scribe's personal observations and the provider's statements to them.  I, Lurline Del MD, have reviewed the above documentation for accuracy and completeness, and I agree with the above.    *Total Encounter Time as defined by the Centers for Medicare and Medicaid Services includes, in addition to the face-to-face time of a patient visit (documented in the note above) non-face-to-face time: obtaining and reviewing outside history, ordering and reviewing medications, tests or procedures, care coordination (communications with other health care professionals or caregivers) and documentation in the medical record.

## 2019-08-30 NOTE — Progress Notes (Signed)
Assessment: 59 y/o [place] woman status post right breast upper outer quadrant biopsy 08/18/2019 showing ductal carcinoma in situ, grade 2, estrogen and progesterone receptor positive.  (1) definitive surgery pending: Consider COMET trial  (2) adjuvant radiation as appropriate  (3) antiestrogens

## 2019-08-31 ENCOUNTER — Encounter (HOSPITAL_BASED_OUTPATIENT_CLINIC_OR_DEPARTMENT_OTHER): Payer: Self-pay | Admitting: Surgery

## 2019-08-31 ENCOUNTER — Telehealth: Payer: Self-pay | Admitting: Oncology

## 2019-08-31 ENCOUNTER — Inpatient Hospital Stay: Payer: BC Managed Care – PPO | Attending: Oncology | Admitting: Oncology

## 2019-08-31 ENCOUNTER — Other Ambulatory Visit: Payer: Self-pay

## 2019-08-31 ENCOUNTER — Inpatient Hospital Stay: Payer: BC Managed Care – PPO

## 2019-08-31 VITALS — BP 135/76 | HR 74 | Temp 98.3°F | Resp 18 | Ht 66.0 in | Wt 211.5 lb

## 2019-08-31 DIAGNOSIS — D0511 Intraductal carcinoma in situ of right breast: Secondary | ICD-10-CM | POA: Insufficient documentation

## 2019-08-31 DIAGNOSIS — C50411 Malignant neoplasm of upper-outer quadrant of right female breast: Secondary | ICD-10-CM

## 2019-08-31 DIAGNOSIS — Z170 Estrogen receptor positive status [ER+]: Secondary | ICD-10-CM | POA: Diagnosis not present

## 2019-08-31 DIAGNOSIS — Z90722 Acquired absence of ovaries, bilateral: Secondary | ICD-10-CM | POA: Diagnosis not present

## 2019-08-31 LAB — CBC WITH DIFFERENTIAL (CANCER CENTER ONLY)
Abs Immature Granulocytes: 0.01 10*3/uL (ref 0.00–0.07)
Basophils Absolute: 0 10*3/uL (ref 0.0–0.1)
Basophils Relative: 0 %
Eosinophils Absolute: 0.1 10*3/uL (ref 0.0–0.5)
Eosinophils Relative: 1 %
HCT: 38.4 % (ref 36.0–46.0)
Hemoglobin: 12.1 g/dL (ref 12.0–15.0)
Immature Granulocytes: 0 %
Lymphocytes Relative: 30 %
Lymphs Abs: 2.4 10*3/uL (ref 0.7–4.0)
MCH: 29.4 pg (ref 26.0–34.0)
MCHC: 31.5 g/dL (ref 30.0–36.0)
MCV: 93.2 fL (ref 80.0–100.0)
Monocytes Absolute: 0.5 10*3/uL (ref 0.1–1.0)
Monocytes Relative: 7 %
Neutro Abs: 4.9 10*3/uL (ref 1.7–7.7)
Neutrophils Relative %: 62 %
Platelet Count: 257 10*3/uL (ref 150–400)
RBC: 4.12 MIL/uL (ref 3.87–5.11)
RDW: 11.9 % (ref 11.5–15.5)
WBC Count: 8 10*3/uL (ref 4.0–10.5)
nRBC: 0 % (ref 0.0–0.2)

## 2019-08-31 LAB — CMP (CANCER CENTER ONLY)
ALT: 13 U/L (ref 0–44)
AST: 14 U/L — ABNORMAL LOW (ref 15–41)
Albumin: 3.9 g/dL (ref 3.5–5.0)
Alkaline Phosphatase: 71 U/L (ref 38–126)
Anion gap: 8 (ref 5–15)
BUN: 13 mg/dL (ref 6–20)
CO2: 28 mmol/L (ref 22–32)
Calcium: 8.9 mg/dL (ref 8.9–10.3)
Chloride: 106 mmol/L (ref 98–111)
Creatinine: 0.87 mg/dL (ref 0.44–1.00)
GFR, Est AFR Am: >60 mL/min (ref >60)
GFR, Estimated: >60 mL/min (ref >60)
Glucose, Bld: 92 mg/dL (ref 70–99)
Potassium: 4 mmol/L (ref 3.5–5.1)
Sodium: 142 mmol/L (ref 135–145)
Total Bilirubin: 0.2 mg/dL — ABNORMAL LOW (ref 0.3–1.2)
Total Protein: 7.8 g/dL (ref 6.5–8.1)

## 2019-08-31 NOTE — Telephone Encounter (Signed)
Scheduling Message Entered by Chauncey Cruel on 08/31/2019 at 4:31 PM Priority: Routine <No visit type provided>  Department: [facility]  Provider: Chauncey Cruel, MD  Appointment Notes:  Please schedule visit with genetics any day and the week of March 8 through March 12   Called pt - per patient - she will give [place] a call back to schedule.

## 2019-09-01 ENCOUNTER — Encounter: Payer: Self-pay | Admitting: *Deleted

## 2019-09-01 ENCOUNTER — Telehealth: Payer: Self-pay | Admitting: Oncology

## 2019-09-01 NOTE — Telephone Encounter (Signed)
I left a message regarding video visit  °

## 2019-09-01 NOTE — Progress Notes (Signed)

## 2019-09-04 ENCOUNTER — Other Ambulatory Visit (HOSPITAL_COMMUNITY)
Admission: RE | Admit: 2019-09-04 | Discharge: 2019-09-04 | Disposition: A | Discharge status: Home / Self Care | Payer: BC Managed Care – PPO | Source: Ambulatory Visit | Attending: Surgery | Admitting: Surgery

## 2019-09-04 DIAGNOSIS — Z20822 Contact with and (suspected) exposure to covid-19: Secondary | ICD-10-CM | POA: Diagnosis not present

## 2019-09-04 DIAGNOSIS — Z01812 Encounter for preprocedural laboratory examination: Secondary | ICD-10-CM | POA: Insufficient documentation

## 2019-09-04 LAB — SARS CORONAVIRUS 2 (TAT 6-24 HRS): SARS Coronavirus 2: NEGATIVE

## 2019-09-07 ENCOUNTER — Ambulatory Visit
Admission: RE | Admit: 2019-09-07 | Discharge: 2019-09-07 | Disposition: A | Discharge status: Home / Self Care | Payer: BC Managed Care – PPO | Source: Ambulatory Visit | Attending: Surgery | Admitting: Surgery

## 2019-09-07 ENCOUNTER — Other Ambulatory Visit: Payer: Self-pay

## 2019-09-07 DIAGNOSIS — D0511 Intraductal carcinoma in situ of right breast: Secondary | ICD-10-CM

## 2019-09-08 ENCOUNTER — Encounter (HOSPITAL_BASED_OUTPATIENT_CLINIC_OR_DEPARTMENT_OTHER): Payer: Self-pay | Admitting: Surgery

## 2019-09-08 ENCOUNTER — Ambulatory Visit (HOSPITAL_BASED_OUTPATIENT_CLINIC_OR_DEPARTMENT_OTHER)
Admission: RE | Admit: 2019-09-08 | Discharge: 2019-09-08 | Disposition: A | Discharge status: Home / Self Care | Payer: BC Managed Care – PPO | Attending: Surgery | Admitting: Surgery

## 2019-09-08 ENCOUNTER — Ambulatory Visit (HOSPITAL_BASED_OUTPATIENT_CLINIC_OR_DEPARTMENT_OTHER): Payer: BC Managed Care – PPO | Admitting: Certified Registered Nurse Anesthetist

## 2019-09-08 ENCOUNTER — Ambulatory Visit
Admission: RE | Admit: 2019-09-08 | Discharge: 2019-09-08 | Disposition: A | Discharge status: Home / Self Care | Payer: BC Managed Care – PPO | Source: Ambulatory Visit | Attending: Surgery | Admitting: Surgery

## 2019-09-08 ENCOUNTER — Encounter (HOSPITAL_BASED_OUTPATIENT_CLINIC_OR_DEPARTMENT_OTHER)
Admission: RE | Disposition: A | Discharge status: Home / Self Care | Payer: Self-pay | Source: Home / Self Care | Attending: Surgery

## 2019-09-08 ENCOUNTER — Other Ambulatory Visit: Payer: Self-pay

## 2019-09-08 DIAGNOSIS — D0511 Intraductal carcinoma in situ of right breast: Secondary | ICD-10-CM | POA: Insufficient documentation

## 2019-09-08 HISTORY — PX: BREAST LUMPECTOMY: SHX2

## 2019-09-08 HISTORY — PX: BREAST LUMPECTOMY WITH RADIOACTIVE SEED LOCALIZATION: SHX6424

## 2019-09-08 SURGERY — BREAST LUMPECTOMY WITH RADIOACTIVE SEED LOCALIZATION
Anesthesia: General | Site: Breast | Laterality: Right

## 2019-09-08 MED ORDER — FENTANYL CITRATE (PF) 100 MCG/2ML IJ SOLN: 50.0000 ug | INTRAMUSCULAR | Status: DC | PRN

## 2019-09-08 MED ORDER — EPHEDRINE SULFATE-NACL 50-0.9 MG/10ML-% IV SOSY
PREFILLED_SYRINGE | INTRAVENOUS | Status: DC | PRN
  Administered 2019-09-08 (×3): 10 mg via INTRAVENOUS

## 2019-09-08 MED ORDER — FENTANYL CITRATE (PF) 100 MCG/2ML IJ SOLN
INTRAMUSCULAR | Status: DC | PRN
  Administered 2019-09-08: 50 ug via INTRAVENOUS
  Administered 2019-09-08 (×2): 25 ug via INTRAVENOUS

## 2019-09-08 MED ORDER — CHLORHEXIDINE GLUCONATE CLOTH 2 % EX PADS: 6.0000 | MEDICATED_PAD | Freq: Once | CUTANEOUS | Status: DC

## 2019-09-08 MED ORDER — OXYCODONE HCL 5 MG PO TABS
ORAL_TABLET | ORAL | Status: AC
  Filled 2019-09-08: qty 1

## 2019-09-08 MED ORDER — LIDOCAINE 2% (20 MG/ML) 5 ML SYRINGE
INTRAMUSCULAR | Status: DC | PRN
  Administered 2019-09-08: 80 mg via INTRAVENOUS

## 2019-09-08 MED ORDER — GABAPENTIN 300 MG PO CAPS
300.0000 mg | ORAL_CAPSULE | ORAL | Status: AC
  Administered 2019-09-08: 300 mg via ORAL

## 2019-09-08 MED ORDER — GABAPENTIN 300 MG PO CAPS
ORAL_CAPSULE | ORAL | Status: AC
  Filled 2019-09-08: qty 1

## 2019-09-08 MED ORDER — ONDANSETRON HCL 4 MG/2ML IJ SOLN
INTRAMUSCULAR | Status: DC | PRN
  Administered 2019-09-08: 4 mg via INTRAVENOUS

## 2019-09-08 MED ORDER — MIDAZOLAM HCL 2 MG/2ML IJ SOLN
INTRAMUSCULAR | Status: AC
  Filled 2019-09-08: qty 2

## 2019-09-08 MED ORDER — CELECOXIB 200 MG PO CAPS
ORAL_CAPSULE | ORAL | Status: AC
  Filled 2019-09-08: qty 1

## 2019-09-08 MED ORDER — CEFAZOLIN SODIUM-DEXTROSE 2-4 GM/100ML-% IV SOLN
2.0000 g | INTRAVENOUS | Status: AC
  Administered 2019-09-08: 2 g via INTRAVENOUS

## 2019-09-08 MED ORDER — DEXAMETHASONE SODIUM PHOSPHATE 10 MG/ML IJ SOLN
INTRAMUSCULAR | Status: DC | PRN
  Administered 2019-09-08: 10 mg via INTRAVENOUS

## 2019-09-08 MED ORDER — HYDROMORPHONE HCL 1 MG/ML IJ SOLN: 0.2500 mg | INTRAMUSCULAR | Status: DC | PRN

## 2019-09-08 MED ORDER — MEPERIDINE HCL 25 MG/ML IJ SOLN: 6.2500 mg | INTRAMUSCULAR | Status: DC | PRN

## 2019-09-08 MED ORDER — VASOPRESSIN 20 UNIT/ML IV SOLN
INTRAVENOUS | Status: AC
  Filled 2019-09-08: qty 1

## 2019-09-08 MED ORDER — MIDAZOLAM HCL 5 MG/5ML IJ SOLN
INTRAMUSCULAR | Status: DC | PRN
  Administered 2019-09-08: 2 mg via INTRAVENOUS

## 2019-09-08 MED ORDER — LIDOCAINE 2% (20 MG/ML) 5 ML SYRINGE
INTRAMUSCULAR | Status: AC
  Filled 2019-09-08: qty 5

## 2019-09-08 MED ORDER — ACETAMINOPHEN 500 MG PO TABS
ORAL_TABLET | ORAL | Status: AC
  Filled 2019-09-08: qty 2

## 2019-09-08 MED ORDER — OXYCODONE HCL 5 MG PO TABS
5.0000 mg | ORAL_TABLET | Freq: Once | ORAL | Status: AC | PRN
  Administered 2019-09-08: 13:00:00 5 mg via ORAL

## 2019-09-08 MED ORDER — CELECOXIB 200 MG PO CAPS
200.0000 mg | ORAL_CAPSULE | ORAL | Status: AC
  Administered 2019-09-08: 200 mg via ORAL

## 2019-09-08 MED ORDER — HYDROCODONE-ACETAMINOPHEN 5-325 MG PO TABS: 1.0000 | ORAL_TABLET | Freq: Four times a day (QID) | ORAL | 0 refills | Status: DC | PRN

## 2019-09-08 MED ORDER — LACTATED RINGERS IV SOLN: INTRAVENOUS | Status: DC

## 2019-09-08 MED ORDER — PROMETHAZINE HCL 25 MG/ML IJ SOLN: 6.2500 mg | INTRAMUSCULAR | Status: DC | PRN

## 2019-09-08 MED ORDER — CEFAZOLIN SODIUM-DEXTROSE 2-4 GM/100ML-% IV SOLN
INTRAVENOUS | Status: AC
  Filled 2019-09-08: qty 100

## 2019-09-08 MED ORDER — ONDANSETRON HCL 4 MG/2ML IJ SOLN
INTRAMUSCULAR | Status: AC
  Filled 2019-09-08: qty 2

## 2019-09-08 MED ORDER — IBUPROFEN 800 MG PO TABS: 800.0000 mg | ORAL_TABLET | Freq: Three times a day (TID) | ORAL | 0 refills | Status: AC | PRN

## 2019-09-08 MED ORDER — OXYCODONE HCL 5 MG/5ML PO SOLN: 5.0000 mg | Freq: Once | ORAL | Status: AC | PRN

## 2019-09-08 MED ORDER — MIDAZOLAM HCL 2 MG/2ML IJ SOLN: 1.0000 mg | INTRAMUSCULAR | Status: DC | PRN

## 2019-09-08 MED ORDER — FENTANYL CITRATE (PF) 100 MCG/2ML IJ SOLN
INTRAMUSCULAR | Status: AC
  Filled 2019-09-08: qty 2

## 2019-09-08 MED ORDER — PROPOFOL 10 MG/ML IV BOLUS
INTRAVENOUS | Status: DC | PRN
  Administered 2019-09-08: 40 mg via INTRAVENOUS
  Administered 2019-09-08: 160 mg via INTRAVENOUS

## 2019-09-08 MED ORDER — BUPIVACAINE HCL (PF) 0.25 % IJ SOLN
INTRAMUSCULAR | Status: DC | PRN
  Administered 2019-09-08 (×2): 10 mL

## 2019-09-08 MED ORDER — DEXAMETHASONE SODIUM PHOSPHATE 10 MG/ML IJ SOLN
INTRAMUSCULAR | Status: AC
  Filled 2019-09-08: qty 1

## 2019-09-08 MED ORDER — ACETAMINOPHEN 500 MG PO TABS
1000.0000 mg | ORAL_TABLET | ORAL | Status: AC
  Administered 2019-09-08: 1000 mg via ORAL

## 2019-09-08 SURGICAL SUPPLY — 50 items
ADH SKN CLS APL DERMABOND .7 (GAUZE/BANDAGES/DRESSINGS) ×1
APL PRP STRL LF DISP 70% ISPRP (MISCELLANEOUS) ×1
APPLIER CLIP 9.375 MED OPEN (MISCELLANEOUS)
APR CLP MED 9.3 20 MLT OPN (MISCELLANEOUS)
BINDER BREAST LRG (GAUZE/BANDAGES/DRESSINGS) IMPLANT
BINDER BREAST MEDIUM (GAUZE/BANDAGES/DRESSINGS) IMPLANT
BINDER BREAST XLRG (GAUZE/BANDAGES/DRESSINGS) IMPLANT
BINDER BREAST XXLRG (GAUZE/BANDAGES/DRESSINGS) ×1 IMPLANT
BLADE SURG 15 STRL LF DISP TIS (BLADE) ×1 IMPLANT
BLADE SURG 15 STRL SS (BLADE) ×2
CANISTER SUC SOCK COL 7IN (MISCELLANEOUS) IMPLANT
CANISTER SUCT 1200ML W/VALVE (MISCELLANEOUS) ×1 IMPLANT
CHLORAPREP W/TINT 26 (MISCELLANEOUS) ×2 IMPLANT
CLIP APPLIE 9.375 MED OPEN (MISCELLANEOUS) IMPLANT
COVER BACK TABLE 60X90IN (DRAPES) ×2 IMPLANT
COVER MAYO STAND STRL (DRAPES) ×2 IMPLANT
COVER PROBE W GEL 5X96 (DRAPES) ×2 IMPLANT
COVER WAND RF STERILE (DRAPES) IMPLANT
DECANTER SPIKE VIAL GLASS SM (MISCELLANEOUS) ×1 IMPLANT
DERMABOND ADVANCED (GAUZE/BANDAGES/DRESSINGS) ×1
DERMABOND ADVANCED .7 DNX12 (GAUZE/BANDAGES/DRESSINGS) ×1 IMPLANT
DRAPE LAPAROSCOPIC ABDOMINAL (DRAPES) IMPLANT
DRAPE LAPAROTOMY 100X72 PEDS (DRAPES) ×2 IMPLANT
DRAPE UTILITY XL STRL (DRAPES) ×2 IMPLANT
ELECT COATED BLADE 2.86 ST (ELECTRODE) ×2 IMPLANT
ELECT REM PT RETURN 9FT ADLT (ELECTROSURGICAL) ×2
ELECTRODE REM PT RTRN 9FT ADLT (ELECTROSURGICAL) ×1 IMPLANT
GLOVE BIOGEL PI IND STRL 8 (GLOVE) ×1 IMPLANT
GLOVE BIOGEL PI INDICATOR 8 (GLOVE) ×1
GLOVE ECLIPSE 8.0 STRL XLNG CF (GLOVE) ×2 IMPLANT
GOWN STRL REUS W/ TWL LRG LVL3 (GOWN DISPOSABLE) ×2 IMPLANT
GOWN STRL REUS W/TWL LRG LVL3 (GOWN DISPOSABLE) ×4
HEMOSTAT ARISTA ABSORB 3G PWDR (HEMOSTASIS) IMPLANT
HEMOSTAT SNOW SURGICEL 2X4 (HEMOSTASIS) IMPLANT
KIT MARKER MARGIN INK (KITS) ×2 IMPLANT
NDL HYPO 25X1 1.5 SAFETY (NEEDLE) ×1 IMPLANT
NEEDLE HYPO 25X1 1.5 SAFETY (NEEDLE) ×2 IMPLANT
NS IRRIG 1000ML POUR BTL (IV SOLUTION) ×2 IMPLANT
PACK BASIN DAY SURGERY FS (CUSTOM PROCEDURE TRAY) ×2 IMPLANT
PENCIL SMOKE EVACUATOR (MISCELLANEOUS) ×2 IMPLANT
SLEEVE SCD COMPRESS KNEE MED (MISCELLANEOUS) ×2 IMPLANT
SPONGE LAP 4X18 RFD (DISPOSABLE) ×2 IMPLANT
SUT MNCRL AB 4-0 PS2 18 (SUTURE) ×2 IMPLANT
SUT SILK 2 0 SH (SUTURE) IMPLANT
SUT VICRYL 3-0 CR8 SH (SUTURE) ×2 IMPLANT
SYR CONTROL 10ML LL (SYRINGE) ×2 IMPLANT
TOWEL GREEN STERILE FF (TOWEL DISPOSABLE) ×2 IMPLANT
TRAY FAXITRON CT DISP (TRAY / TRAY PROCEDURE) ×2 IMPLANT
TUBE CONNECTING 20X1/4 (TUBING) ×1 IMPLANT
YANKAUER SUCT BULB TIP NO VENT (SUCTIONS) ×1 IMPLANT

## 2019-09-08 NOTE — Anesthesia Preprocedure Evaluation (Signed)
Anesthesia Evaluation  Patient identified by MRN, date of birth, ID band Patient awake    Reviewed: Allergy & Precautions, NPO status , Patient's Chart, lab work & pertinent test results  Airway Mallampati: II  TM Distance: >3 FB Neck ROM: Full    Dental no notable dental hx. (+) Dental Advisory Given   Pulmonary neg pulmonary ROS,    Pulmonary exam normal breath sounds clear to auscultation       Cardiovascular negative cardio ROS Normal cardiovascular exam Rhythm:Regular Rate:Normal     Neuro/Psych  Headaches, negative psych ROS   GI/Hepatic negative GI ROS, Neg liver ROS,   Endo/Other  negative endocrine ROS  Renal/GU negative Renal ROS     Musculoskeletal negative musculoskeletal ROS (+)   Abdominal   Peds  Hematology negative hematology ROS (+)   Anesthesia Other Findings   Reproductive/Obstetrics negative OB ROS                             Anesthesia Physical Anesthesia Plan  ASA: II  Anesthesia Plan: General   Post-op Pain Management:    Induction: Intravenous  PONV Risk Score and Plan: 4 or greater and Ondansetron, Dexamethasone, Midazolam, Treatment may vary due to age or medical condition and Scopolamine patch - Pre-op  Airway Management Planned: LMA  Additional Equipment: None  Intra-op Plan:   Post-operative Plan: Extubation in OR  Informed Consent: I have reviewed the patients History and Physical, chart, labs and discussed the procedure including the risks, benefits and alternatives for the proposed anesthesia with the patient or authorized representative who has indicated his/her understanding and acceptance.     Dental advisory given  Plan Discussed with: CRNA  Anesthesia Plan Comments:         Anesthesia Quick Evaluation

## 2019-09-08 NOTE — Anesthesia Postprocedure Evaluation (Signed)
Anesthesia Post Note  Patient: Felicia Beasley  Procedure(s) Performed: RIGHT BREAST LUMPECTOMY WITH RADIOACTIVE SEED LOCALIZATION (Right Breast)     Patient location during evaluation: PACU Anesthesia Type: General Level of consciousness: sedated and patient cooperative Pain management: pain level controlled Vital Signs Assessment: post-procedure vital signs reviewed and stable Respiratory status: spontaneous breathing Cardiovascular status: stable Anesthetic complications: no    Last Vitals:  Vitals:   09/08/19 1245 09/08/19 1300  BP:  129/79  Pulse:  78  Resp:  16  Temp:  36.5 C  SpO2: 100% 100%    Last Pain:  Vitals:   09/08/19 1300  TempSrc:   PainSc: 2                  Nolon Nations

## 2019-09-08 NOTE — Op Note (Signed)
Preoperative diagnosis: Right breast DCIS  Postoperative diagnosis: Same  Procedure: Right breast seed localized lumpectomy upper outer quadrant DCIS  Surgeon: Erroll Luna, MD  Anesthesia: LMA with local of 0.25% Marcaine plain  EBL: Minimal  Specimen: Right breast tissue with seed and clip verified by Faxitron to have both the seed and clip present sent to pathology  Drains: None  IV fluids: Per anesthesia record  Indications for procedure: The patient is a 59 year old female who had a mammographic abnormality located on the right breast.  Core biopsy showed DCIS.  She was seen by medical oncology as well and she opted for breast conserving surgery and possible radiation therapy.  We discussed breast conservation versus mastectomy with reconstruction.  We discussed the pros and cons of each as well as long-term expectations of each.  She opted for right breast lumpectomy after discussion.The procedure has been discussed with the patient. Alternatives to surgery have been discussed with the patient.  Risks of surgery include bleeding,  Infection,  Seroma formation, death,  and the need for further surgery.   The patient understands and wishes to proceed.   Description of procedure: The patient was met in the holding area and questions were answered.  Neoprobe was used to verify seed location right breast upper outer quadrant.  Films were available for review.  All questions were answered.  She was taken back to the operating.  She is placed supine upon the OR table.  After induction of general esthesia, the right breast was prepped and draped in sterile fashion timeout was performed.  Proper patient, site and procedure verified.  She received appropriate preoperative antibiotics.  Neoprobe was used and seed localized right breast upper outer quadrant.  Curvilinear incision was made over the signal dissection was carried down to the area where the seed and clip were located.  The area was  excised with a grossly negative margin.  Neoprobe used to verify seed in specimen and Faxitron set showed seed and clip to be in the specimen.  Hemostasis achieved.  Specimen was oriented with ink and sent to pathology.  Cavities made hemostatic after irrigation.  This is done with cautery.  Clips were placed in the cavity is closed with 3-0 Vicryl for Monocryl.  Dermabond applied.  All final counts found to be correct.  Breast binder placed.  The patient was awoke extubated taken to recovery in satisfactory condition.

## 2019-09-08 NOTE — Anesthesia Procedure Notes (Signed)
Procedure Name: LMA Insertion Date/Time: 09/08/2019 11:13 AM Performed by: Genelle Bal, CRNA Pre-anesthesia Checklist: Patient identified, Emergency Drugs available, Suction available and Patient being monitored Patient Re-evaluated:Patient Re-evaluated prior to induction Oxygen Delivery Method: Circle system utilized Preoxygenation: Pre-oxygenation with 100% oxygen Induction Type: IV induction Ventilation: Mask ventilation without difficulty LMA: LMA inserted LMA Size: 4.0 Number of attempts: 1 Airway Equipment and Method: Bite block Placement Confirmation: positive ETCO2 Tube secured with: Tape Dental Injury: Teeth and Oropharynx as per pre-operative assessment

## 2019-09-08 NOTE — Transfer of Care (Signed)
Immediate Anesthesia Transfer of Care Note  Patient: Felicia Beasley  Procedure(s) Performed: RIGHT BREAST LUMPECTOMY WITH RADIOACTIVE SEED LOCALIZATION (Right Breast)  Patient Location: PACU  Anesthesia Type:General  Level of Consciousness: awake, alert  and oriented  Airway & Oxygen Therapy: Patient Spontanous Breathing and Patient connected to face mask oxygen  Post-op Assessment: Report given to RN and Post -op Vital signs reviewed and stable  Post vital signs: Reviewed and stable  Last Vitals:  Vitals Value Taken Time  BP 109/72   Temp    Pulse 90 09/08/19 1204  Resp 18 09/08/19 1204  SpO2 100 % 09/08/19 1204  Vitals shown include unvalidated device data.  Last Pain:  Vitals:   09/08/19 0918  TempSrc: Tympanic  PainSc: 0-No pain         Complications: No apparent anesthesia complications

## 2019-09-08 NOTE — Interval H&P Note (Signed)
History and Physical Interval Note:  09/08/2019 10:26 AM  Felicia Beasley [name]  has presented today for surgery, with the diagnosis of RIGHT BREAST DUCTAL CARCINOMA IN SITU.  The various methods of treatment have been discussed with the patient and family. After consideration of risks, benefits and other options for treatment, the patient has consented to  Procedure(s): RIGHT BREAST LUMPECTOMY WITH RADIOACTIVE SEED LOCALIZATION (Right) as a surgical intervention.  The patient's history has been reviewed, patient examined, no change in status, stable for surgery.  I have reviewed the patient's chart and labs.  Questions were answered to the patient's satisfaction.     [place]

## 2019-09-08 NOTE — Discharge Instructions (Signed)
[facility] °Office Phone Number [phone] ° °BREAST BIOPSY/ PARTIAL MASTECTOMY: POST OP INSTRUCTIONS ° °Always review your discharge instruction sheet given to you by the facility where your surgery was performed. ° °IF YOU HAVE DISABILITY OR FAMILY LEAVE FORMS, YOU MUST BRING THEM TO THE OFFICE FOR PROCESSING.  DO NOT GIVE THEM TO YOUR DOCTOR. ° °1. A prescription for pain medication may be given to you upon discharge.  Take your pain medication as prescribed, if needed.  If narcotic pain medicine is not needed, then you may take acetaminophen (Tylenol) or ibuprofen (Advil) as needed. °2. Take your usually prescribed medications unless otherwise directed °3. If you need a refill on your pain medication, please contact your pharmacy.  They will contact our office to request authorization.  Prescriptions will not be filled after 5pm or on week-ends. °4. You should eat very light the first 24 hours after surgery, such as soup, crackers, pudding, etc.  Resume your normal diet the day after surgery. °5. Most patients will experience some swelling and bruising in the breast.  Ice packs and a good support bra will help.  Swelling and bruising can take several days to resolve.  °6. It is common to experience some constipation if taking pain medication after surgery.  Increasing fluid intake and taking a stool softener will usually help or prevent this problem from occurring.  A mild laxative (Milk of Magnesia or Miralax) should be taken according to package directions if there are no bowel movements after 48 hours. °7. Unless discharge instructions indicate otherwise, you may remove your bandages 24-48 hours after surgery, and you may shower at that time.  You may have steri-strips (small skin tapes) in place directly over the incision.  These strips should be left on the skin for 7-10 days.  If your surgeon used skin glue on the incision, you may shower in 24 hours.  The glue will flake off over the  next 2-3 weeks.  Any sutures or staples will be removed at the office during your follow-up visit. °8. ACTIVITIES:  You may resume regular daily activities (gradually increasing) beginning the next day.  Wearing a good support bra or sports bra minimizes pain and swelling.  You may have sexual intercourse when it is comfortable. °a. You may drive when you no longer are taking prescription pain medication, you can comfortably wear a seatbelt, and you can safely maneuver your car and apply brakes. °b. RETURN TO WORK:  ______________________________________________________________________________________ °9. You should see your doctor in the office for a follow-up appointment approximately two weeks after your surgery.  Your doctor’s nurse will typically make your follow-up appointment when she calls you with your pathology report.  Expect your pathology report 2-3 business days after your surgery.  You may call to check if you do not hear from us after three days. °10. OTHER INSTRUCTIONS: _______________________________________________________________________________________________ _____________________________________________________________________________________________________________________________________ °_____________________________________________________________________________________________________________________________________ °_____________________________________________________________________________________________________________________________________ ° °WHEN TO CALL YOUR DOCTOR: °1. Fever over 101.0 °2. Nausea and/or vomiting. °3. Extreme swelling or bruising. °4. Continued bleeding from incision. °5. Increased pain, redness, or drainage from the incision. ° °The clinic staff is available to answer your questions during regular business hours.  Please don’t hesitate to call and ask to speak to one of the nurses for clinical concerns.  If you have a medical emergency, go to the nearest  emergency room or call 911.  A surgeon from [facility] is always on call at the hospital. ° °For further questions, please visit [email or web address]  ° ° ° ° °  Post Anesthesia Home Care Instructions ° °Activity: °Get plenty of rest for the remainder of the day. A responsible individual must stay with you for 24 hours following the procedure.  °For the next 24 hours, DO NOT: °-Drive a car °-Operate machinery °-Drink alcoholic beverages °-Take any medication unless instructed by your physician °-Make any legal decisions or sign important papers. ° °Meals: °Start with liquid foods such as gelatin or soup. Progress to regular foods as tolerated. Avoid greasy, spicy, heavy foods. If nausea and/or vomiting occur, drink only clear liquids until the nausea and/or vomiting subsides. Call your physician if vomiting continues. ° °Special Instructions/Symptoms: °Your throat may feel dry or sore from the anesthesia or the breathing tube placed in your throat during surgery. If this causes discomfort, gargle with warm salt water. The discomfort should disappear within 24 hours. ° °If you had a scopolamine patch placed behind your ear for the management of post- operative nausea and/or vomiting: ° °1. The medication in the patch is effective for 72 hours, after which it should be removed.  Wrap patch in a tissue and discard in the trash. Wash hands thoroughly with soap and water. °2. You may remove the patch earlier than 72 hours if you experience unpleasant side effects which may include dry mouth, dizziness or visual disturbances. °3. Avoid touching the patch. Wash your hands with soap and water after contact with the patch. °  ° °

## 2019-09-09 ENCOUNTER — Inpatient Hospital Stay (HOSPITAL_BASED_OUTPATIENT_CLINIC_OR_DEPARTMENT_OTHER): Payer: BC Managed Care – PPO | Admitting: Genetic Counselor

## 2019-09-09 ENCOUNTER — Encounter: Payer: Self-pay | Admitting: *Deleted

## 2019-09-09 DIAGNOSIS — D0511 Intraductal carcinoma in situ of right breast: Secondary | ICD-10-CM

## 2019-09-09 DIAGNOSIS — Z8041 Family history of malignant neoplasm of ovary: Secondary | ICD-10-CM

## 2019-09-09 DIAGNOSIS — Z170 Estrogen receptor positive status [ER+]: Secondary | ICD-10-CM

## 2019-09-09 DIAGNOSIS — C50411 Malignant neoplasm of upper-outer quadrant of right female breast: Secondary | ICD-10-CM

## 2019-09-09 NOTE — Progress Notes (Signed)
REFERRING PROVIDER: Chauncey Cruel, MD [address] [place],  [place] [address]  PRIMARY PROVIDER:  Harlan Stains, MD  PRIMARY REASON FOR VISIT:  1. Malignant neoplasm of upper-outer quadrant of right breast in female, estrogen receptor positive ([place])   2. Family history of ovarian cancer     I connected with Felicia Beasley. Bonebrake on 09/09/2019 at 11:00 am EDT by MyChart video conference and verified that I am speaking with the correct person using two identifiers.   Patient location: Home Provider location: [facility] office  HISTORY OF PRESENT ILLNESS:   Felicia Beasley. Aure, a 59 y.o. female, was seen for a [place] cancer genetics consultation at the request of Dr. Jana Hakim due to a personal history of breast cancer and a family history of ovarian cancer.  Felicia Beasley. Lowdermilk presents to clinic today to discuss the possibility of a hereditary predisposition to cancer, genetic testing, and to further clarify her future cancer risks, as well as potential cancer risks for family members.   In 2021, at the age of 70, Felicia Beasley. Rudge was diagnosed with ductal carcinoma in situ of the right breast. The treatment plan includes surgery (lumpectomy performed 09/08/19), adjuvant radiation, and antiestrogen therapy.   CANCER HISTORY:  Oncology History  Malignant neoplasm of upper-outer quadrant of right breast in female, estrogen receptor positive ([place])  08/18/2019 Cancer Staging   Staging form: Breast, AJCC 8th Edition - Clinical stage from 08/18/2019: Stage 0 (cTis (DCIS), cN0, cM0, ER+, PR+) - Signed by Gardenia Phlegm, NP on 08/25/2019   08/25/2019 Initial Diagnosis   Malignant neoplasm of upper-outer quadrant of right breast in female, estrogen receptor positive ([place])     RISK FACTORS:  Menarche was at age 89.  First live birth at age 17.  OCP use for approximately 2 years.  Ovaries intact: no.  Hysterectomy: yes.  Menopausal status: postmenopausal.  HRT use: 0 years. Colonoscopy: yes;  normal. Mammogram within the last year: yes. Number of breast biopsies: 1. Any excessive radiation exposure in the past: no  Past Medical History:  Diagnosis Date  . Breast cancer ([place])   . Edema leg   . Family history of ovarian cancer   . History of recurrent UTIs   . Migraine headache   . Sinusitis   . Venous insufficiency     Past Surgical History:  Procedure Laterality Date  . ABDOMINAL HYSTERECTOMY Bilateral 04/11/2009   w/ BSO; for menorrhagia and fibroids  . BREAST LUMPECTOMY WITH RADIOACTIVE SEED LOCALIZATION Right 09/08/2019   Procedure: RIGHT BREAST LUMPECTOMY WITH RADIOACTIVE SEED LOCALIZATION;  Surgeon: Erroll Luna, MD;  Location: [facility];  Service: General;  Laterality: Right;  . CESAREAN SECTION      Social History   Socioeconomic History  . Marital status: Married    Spouse name: Not on file  . Number of children: Not on file  . Years of education: Not on file  . Highest education level: Not on file  Occupational History  . Not on file  Tobacco Use  . Smoking status: Never Smoker  . Smokeless tobacco: Never Used  Substance and Sexual Activity  . Alcohol use: No  . Drug use: No  . Sexual activity: Not on file  Other Topics Concern  . Not on file  Social History Narrative  . Not on file   Social Determinants of Health   Financial Resource Strain:   . Difficulty of Paying Living Expenses:   Food Insecurity:   . Worried About Estate manager/land agent  of Food in the Last Year:   . [place] in the Last Year:   Transportation Needs:   . Lack of Transportation (Medical):   Marland Kitchen Lack of Transportation (Non-Medical):   Physical Activity:   . Days of Exercise per Week:   . Minutes of Exercise per Session:   Stress:   . Feeling of Stress :   Social Connections:   . Frequency of Communication with Friends and Family:   . Frequency of Social Gatherings with Friends and Family:   . Attends Religious Services:   . Active Member of Clubs or  Organizations:   . Attends Archivist Meetings:   Marland Kitchen Marital Status:      FAMILY HISTORY:  We obtained a detailed, 4-generation family history.  Significant diagnoses are listed below: Family History  Problem Relation Age of Onset  . Arthritis Mother   . Diabetes Mother   . Arthritis Father   . Ovarian cancer Half-Sister 59  . Emphysema Maternal Grandfather        smoker   Felicia Beasley. Schnieders has two daughters and one son, all in their 22s. She has three maternal half-siblings (two half-brothers and one half-sister). Her maternal half-sister was diagnosed with ovarian cancer at the age of 65 and died when she was 81. Felicia Beasley. Faulstich also has three paternal half-siblings (two half-sisters and one half-brother). These paternal half-siblings are all living in their 55s and have not had cancer.  Felicia Beasley. Lobb mother is currently living at age 79 and has not had cancer. Felicia Beasley. Veno had five maternal uncles and one maternal aunt, all of whom died when they were older than 62. None of her maternal uncles or aunt had cancer. Her maternal grandmother died at age 77 and her maternal grandfather died at age 75. There are no other known diagnoses of cancer on the maternal side of the family.  Felicia Beasley. Tiedt father is currently living at age 58 and has not had cancer. She has five paternal aunts and four paternal uncles. Three of her paternal aunts/uncles have died at ages older than 10, and the remaining are still living. None of these aunts or uncles have had cancer. Felicia Beasley. Watkin's paternal grandmother died at age 56 and her grandfather died in his early 41s. There are no known diagnoses of cancer on the paternal side of the family.  Felicia Beasley. Digiulio is unaware of previous family history of genetic testing for hereditary cancer risks. Her maternal ancestors are of [place] American and Native American descent, and paternal ancestors are of African American descent. There is no reported Ashkenazi Jewish ancestry.  There is no known consanguinity.  GENETIC COUNSELING ASSESSMENT: Felicia Beasley. Perri is a 59 y.o. female with a personal history of breast cancer and a family history of ovarian cancer, which is somewhat suggestive of a hereditary cancer syndrome and predisposition to cancer. We, therefore, discussed and recommended the following at today's visit.   DISCUSSION: We discussed that 5-10% of breast cancer is hereditary, with most cases associated with the BRCA1 and BRCA2 genes.  There are other genes that can be associated with hereditary breast cancer syndromes.  These include ATM, CHEK2 PALB2, etc.  We also discussed that up to 15-20% of ovarian cancer has been associated with a hereditary cancer syndrome. We discussed that testing is beneficial for several reasons, including knowing about other cancer risks, identifying potential screening and risk-reduction options that may be appropriate, and to understand if other family members could be at risk  for cancer and allow them to undergo genetic testing.   We reviewed the characteristics, features and inheritance patterns of hereditary cancer syndromes. We also discussed genetic testing, including the appropriate family members to test, the process of testing, insurance coverage and turn-around-time for results. We discussed the implications of a negative, positive and/or variant of uncertain significant result. We recommended Felicia Beasley. Yun pursue genetic testing for the [facility] Breast Cancer Panel.   The Breast Cancer Panel offered by [facility] includes sequencing and deletion/duplication analysis for the following 16 genes:  ATM, BARD1, BRCA1, BRCA2, BRIP1, CDH1, CHEK2, NBN, NF1, PALB2, PTEN, RAD50, RAD51C, RAD51D, STK11, and TP53.   Based on Felicia Beasley. Firmin's personal and family history of cancer, she meets medical criteria for genetic testing. Despite that she meets criteria, she may still have an out of pocket cost. We discussed that if her out of pocket cost for testing  is over $100, the laboratory will call and confirm whether she wants to proceed with testing.  If the out of pocket cost of testing is less than $100 she will be billed by the genetic testing laboratory.   PLAN:  Felicia Beasley. Razavi would like some time to consider genetic testing and to speak with her husband about testing before proceeding. She will contact us if she would like to move forward with genetic testing. In the meantime, we recommend Felicia Beasley. Gougeon continue to follow the cancer screening guidelines given by her primary healthcare provider.  Felicia Beasley. Sazama questions were answered to her satisfaction today. Our contact information was provided should additional questions or concerns arise. Thank you for the referral and allowing [place] to share in the care of your patient.   Felicia Guy, Felicia Beasley, Felicia Beasley Genetic Counselor [email or web address] Phone: [phone]  The patient was seen for a total of 30 minutes in face-to-face genetic counseling.  This patient was discussed with Drs. Magrinat, Lindi Adie and/or Burr Medico who agrees with the above.    _______________________________________________________________________ For Office Staff:  Number of people involved in session: 1 Was an Intern/ student involved with case: no

## 2019-09-10 ENCOUNTER — Other Ambulatory Visit: Payer: Self-pay

## 2019-09-13 LAB — SURGICAL PATHOLOGY

## 2019-09-14 ENCOUNTER — Encounter: Payer: Self-pay | Admitting: *Deleted

## 2019-09-29 ENCOUNTER — Ambulatory Visit
Admission: RE | Admit: 2019-09-29 | Discharge: 2019-09-29 | Disposition: A | Discharge status: Home / Self Care | Payer: BC Managed Care – PPO | Source: Ambulatory Visit | Attending: Radiation Oncology | Admitting: Radiation Oncology

## 2019-09-29 ENCOUNTER — Encounter: Payer: Self-pay | Admitting: Radiation Oncology

## 2019-09-29 ENCOUNTER — Encounter: Payer: Self-pay | Admitting: *Deleted

## 2019-09-29 ENCOUNTER — Other Ambulatory Visit: Payer: Self-pay

## 2019-09-29 VITALS — BP 117/83 | HR 66 | Temp 97.3°F | Resp 20 | Ht 66.0 in | Wt 214.4 lb

## 2019-09-29 DIAGNOSIS — Z170 Estrogen receptor positive status [ER+]: Secondary | ICD-10-CM | POA: Insufficient documentation

## 2019-09-29 DIAGNOSIS — C50411 Malignant neoplasm of upper-outer quadrant of right female breast: Secondary | ICD-10-CM

## 2019-09-29 DIAGNOSIS — Z87442 Personal history of urinary calculi: Secondary | ICD-10-CM | POA: Insufficient documentation

## 2019-09-29 DIAGNOSIS — Z923 Personal history of irradiation: Secondary | ICD-10-CM | POA: Diagnosis not present

## 2019-09-29 DIAGNOSIS — Z87440 Personal history of urinary (tract) infections: Secondary | ICD-10-CM | POA: Insufficient documentation

## 2019-09-29 DIAGNOSIS — Z7982 Long term (current) use of aspirin: Secondary | ICD-10-CM | POA: Insufficient documentation

## 2019-09-29 DIAGNOSIS — R609 Edema, unspecified: Secondary | ICD-10-CM | POA: Diagnosis not present

## 2019-09-29 DIAGNOSIS — Z510 Encounter for antineoplastic radiation therapy: Secondary | ICD-10-CM | POA: Insufficient documentation

## 2019-09-29 DIAGNOSIS — I872 Venous insufficiency (chronic) (peripheral): Secondary | ICD-10-CM | POA: Insufficient documentation

## 2019-09-29 NOTE — Progress Notes (Signed)
Radiation Oncology         [phone] ________________________________  Initial Outpatient Consultation  Name: Felicia Beasley MRN: [id number]  Date: 09/29/2019  DOB: Feb 07, 1961  CC:Harlan Stains, MD  Erroll Luna, MD   REFERRING PHYSICIAN: Erroll Luna, MD  DIAGNOSIS: The encounter diagnosis was Malignant neoplasm of upper-outer quadrant of right breast in female, estrogen receptor positive ([place]).  Stage 0, Right Breast UOQ, DCIS, ER+ / PR+, Grade 2  HISTORY OF PRESENT ILLNESS::Felicia Beasley is a 59 y.o. female who is accompanied by no one due to COVID-19 restrictions. She underwent a routine screening mammography on 01/25/2019 that showed a possible abnormality in the right breast. She then underwent a unilateral diagnostic mammography and right breast ultrasonography at [facility] on 02/03/2019 that showed a probably benign right breast mass. The patient had a follow-up unilateral diagnostic mammography and right breast ultrasonography on 08/18/2019 that showed a slightly more suspicious appearance of the mammographic asymmetry in the upper outer right breast.  Biopsy on 08/18/2019 showed intermediate grade ductal carcinoma in situ. Prognostic indicators significant for estrogen receptor, 100% positive with strong staining intensity and progesterone receptor, 100% positive with moderate staining intensity.  The patient was seen by Dr. Jana Hakim on 08/31/2019, during which time they discussed surgery followed by adjuvant radiation and anti-estrogens.  She opted to proceed with right breast lumpectomy on 09/08/2019 performed by Dr. Brantley Stage. Pathology from the procedure revealed intermediate grade ductal carcinoma in situ with necrosis spanning 1.5 cm. Margins were negative for in situ carcinoma.   PREVIOUS RADIATION THERAPY: No  PAST MEDICAL HISTORY:  Past Medical History:  Diagnosis Date  . Breast cancer ([place])   . Edema leg   . Family history of ovarian  cancer   . History of recurrent UTIs   . Migraine headache   . Sinusitis   . Venous insufficiency     PAST SURGICAL HISTORY: Past Surgical History:  Procedure Laterality Date  . ABDOMINAL HYSTERECTOMY Bilateral 04/11/2009   w/ BSO; for menorrhagia and fibroids  . BREAST LUMPECTOMY WITH RADIOACTIVE SEED LOCALIZATION Right 09/08/2019   Procedure: RIGHT BREAST LUMPECTOMY WITH RADIOACTIVE SEED LOCALIZATION;  Surgeon: Erroll Luna, MD;  Location: [facility];  Service: General;  Laterality: Right;  . CESAREAN SECTION      FAMILY HISTORY:  Family History  Problem Relation Age of Onset  . Arthritis Mother   . Diabetes Mother   . Arthritis Father   . Ovarian cancer Half-Sister 37  . Emphysema Maternal Grandfather        smoker    SOCIAL HISTORY:  Social History   Tobacco Use  . Smoking status: Never Smoker  . Smokeless tobacco: Never Used  Substance Use Topics  . Alcohol use: No  . Drug use: No    ALLERGIES:  Allergies  Allergen Reactions  . Ciprocinonide [Fluocinolone]     nausea  . Macrobid [Nitrofurantoin Macrocrystal]     migraine    MEDICATIONS:  Current Outpatient Medications  Medication Sig Dispense Refill  . Ascorbic Acid (VITAMIN C) 100 MG tablet Take 100 mg by mouth daily.    Marland Kitchen aspirin EC 81 MG tablet Take 81 mg by mouth daily.    . Cranberry 1000 MG CAPS Take by mouth.    Marland Kitchen HYDROcodone-acetaminophen (NORCO/VICODIN) 5-325 MG tablet Take 1 tablet by mouth every 6 (six) hours as needed for moderate pain. 15 tablet 0  . ibuprofen (ADVIL) 800 MG tablet Take 1 tablet (800 mg total) by  mouth every 8 (eight) hours as needed. 30 tablet 0  . Omega-3 Fatty Acids (FISH OIL) 1000 MG CAPS Fish Oil    . Turmeric 400 MG CAPS Take by mouth.     No current facility-administered medications for this encounter.    REVIEW OF SYSTEMS:  A 10+ POINT REVIEW OF SYSTEMS WAS OBTAINED including neurology, dermatology, psychiatry, cardiac, respiratory, lymph,  extremities, GI, GU, musculoskeletal, constitutional, reproductive, HEENT.  Patient denies any pain within the breast nipple discharge or bleeding prior to diagnosis.   PHYSICAL EXAM:  height is '5\' 6"'$  (1.676 m) and weight is 214 lb 6.4 oz (97.3 kg). Her temperature is 97.3 F (36.3 C) (abnormal). Her blood pressure is 117/83 and her pulse is 66. Her respiration is 20 and oxygen saturation is 100%.   General: Alert and oriented, in no acute distress HEENT: Head is normocephalic. Extraocular movements are intact.  Neck: Neck is supple, no palpable cervical or supraclavicular lymphadenopathy. Heart: Regular in rate and rhythm with no murmurs, rubs, or gallops. Chest: Clear to auscultation bilaterally, with no rhonchi, wheezes, or rales. Abdomen: Soft, nontender, nondistended, with no rigidity or guarding. Extremities: No cyanosis or edema. Lymphatics: see Neck Exam Skin: No concerning lesions. Musculoskeletal: symmetric strength and muscle tone throughout. Neurologic: Cranial nerves II through XII are grossly intact. No obvious focalities. Speech is fluent. Coordination is intact. Psychiatric: Judgment and insight are intact. Affect is appropriate. Left breast: No palpable mass, nipple discharge, or bleeding. Right breast: Well-healing lumpectomy scar in the upper outer quadrant.  No signs of drainage or infection.  No nipple discharge or bleeding  ECOG = 1  0 - Asymptomatic (Fully active, able to carry on all predisease activities without restriction)  1 - Symptomatic but completely ambulatory (Restricted in physically strenuous activity but ambulatory and able to carry out work of a light or sedentary nature. For example, light housework, office work)  2 - Symptomatic, <50% in bed during the day (Ambulatory and capable of all self care but unable to carry out any work activities. Up and about more than 50% of waking hours)  3 - Symptomatic, >50% in bed, but not bedbound (Capable of only  limited self-care, confined to bed or chair 50% or more of waking hours)  4 - Bedbound (Completely disabled. Cannot carry on any self-care. Totally confined to bed or chair)  5 - Death   Eustace Pen MM, Creech RH, Tormey DC, et al. [phone]). "Toxicity and response criteria of the [facility]". [place] Oncol. 5 (6): 649-55  LABORATORY DATA:  Lab Results  Component Value Date   WBC 8.0 08/31/2019   HGB 12.1 08/31/2019   HCT 38.4 08/31/2019   MCV 93.2 08/31/2019   PLT 257 08/31/2019   NEUTROABS 4.9 08/31/2019   Lab Results  Component Value Date   NA 142 08/31/2019   K 4.0 08/31/2019   CL 106 08/31/2019   CO2 28 08/31/2019   GLUCOSE 92 08/31/2019   CREATININE 0.87 08/31/2019   CALCIUM 8.9 08/31/2019      RADIOGRAPHY: MM Breast Surgical Specimen  Result Date: 09/08/2019 CLINICAL DATA:  Status post lumpectomy of the right breast. EXAM: SPECIMEN RADIOGRAPH OF THE RIGHT BREAST COMPARISON:  Previous exam(s). FINDINGS: Status post excision of the right breast. The radioactive seed and biopsy marker clip are present, completely intact, and were marked for pathology. IMPRESSION: Specimen radiograph of the right breast. Electronically Signed   By: Lillia Mountain M.D.   On: 09/08/2019 11:45  MM RT RADIOACTIVE SEED LOC MAMMO GUIDE  Result Date: 09/07/2019 CLINICAL DATA:  Patient presents for radioactive seed localization area DCIS right breast prior to surgical excision. EXAM: MAMMOGRAPHIC GUIDED RADIOACTIVE SEED LOCALIZATION OF THE RIGHT BREAST COMPARISON:  Previous exam(s). FINDINGS: Patient presents for radioactive seed localization prior to surgical excision. I met with the patient and we discussed the procedure of seed localization including benefits and alternatives. We discussed the high likelihood of a successful procedure. We discussed the risks of the procedure including infection, bleeding, tissue injury and further surgery. We discussed the low dose of radioactivity  involved in the procedure. Informed, written consent was given. The usual time-out protocol was performed immediately prior to the procedure. Using mammographic guidance, sterile technique, 1% lidocaine and an I-125 radioactive seed, the coil shaped biopsy clip was localized using a lateral approach. The follow-up mammogram images confirm the seed in the expected location and were marked for Dr. Brantley Stage. Follow-up survey of the patient confirms presence of the radioactive seed. Order number of I-125 seed:  [id number]. Total activity:  9.030 millicuries reference Date: 08/12/2019 The patient tolerated the procedure well and was released from the [facility]. She was given instructions regarding seed removal. IMPRESSION: Radioactive seed localization of the right breast. No apparent complications. Electronically Signed   By: Lajean Manes M.D.   On: 09/07/2019 15:06      IMPRESSION: Stage 0, Right Breast UOQ, DCIS, ER+ / PR+, Grade 2  Patient would be a good candidate for breast conserving therapy with radiation therapy as a component.  Given the patient's young age and findings of necrosis in the specimen,  I would not recommend lumpectomy alone in this situation.  Today, I talked to the patient  about the findings and work-up thus far.  We discussed the natural history of breast cancer (DCIS) and general treatment, highlighting the role of radiotherapy in the management.  We discussed the available radiation techniques, and focused on the details of logistics and delivery.  We reviewed the anticipated acute and late sequelae associated with radiation in this setting.  The patient was encouraged to ask questions that I answered to the best of my ability.  A patient consent form was discussed and signed.  We retained a copy for our records.  The patient would like to proceed with radiation and will be scheduled for CT simulation.  PLAN: Patient will return at 1 PM today for CT simulation.  Given the  patient's large breast size I am unsure whether she would be a candidate for hypofractionated accelerated radiation therapy.  Anticipate between 4 and 6-1/2 weeks of radiation therapy depending on the overall set up.    ------------------------------------------------  Blair Promise, PhD, MD  This document serves as a record of services personally performed by Gery Pray, MD. It was created on his behalf by Clerance Lav, a trained medical scribe. The creation of this record is based on the scribe's personal observations and the provider's statements to them. This document has been checked and approved by the attending provider.

## 2019-09-29 NOTE — Patient Instructions (Signed)
Coronavirus (COVID-19) Are you at risk?  Are you at risk for the Coronavirus (COVID-19)?  To be considered HIGH RISK for Coronavirus (COVID-19), you have to meet the following criteria:  . Traveled to [place], [place], [place], [place] or [place]; or in the [place] to [place], [place], [place], or [place]; and have fever, cough, and shortness of breath within the last 2 weeks of travel OR . Been in close contact with a person diagnosed with COVID-19 within the last 2 weeks and have fever, cough, and shortness of breath . IF YOU DO NOT MEET THESE CRITERIA, YOU ARE CONSIDERED LOW RISK FOR COVID-19.  What to do if you are HIGH RISK for COVID-19?  . If you are having a medical emergency, call 911. . Seek medical care right away. Before you go to a doctor's office, urgent care or emergency department, call ahead and tell them about your recent travel, contact with someone diagnosed with COVID-19, and your symptoms. You should receive instructions from your physician's office regarding next steps of care.  . When you arrive at healthcare provider, tell the healthcare staff immediately you have returned from visiting [place], [place], [place], [place] or [place]; or traveled in the [place] to [place], [place], [place], or [place]; in the last two weeks or you have been in close contact with a person diagnosed with COVID-19 in the last 2 weeks.   . Tell the health care staff about your symptoms: fever, cough and shortness of breath. . After you have been seen by a medical provider, you will be either: o Tested for (COVID-19) and discharged home on quarantine except to seek medical care if symptoms worsen, and asked to  - Stay home and avoid contact with others until you get your results (4-5 days)  - Avoid travel on public transportation if possible (such as bus, train, or airplane) or o Sent to the Emergency Department by EMS for evaluation, COVID-19 testing, and possible  admission depending on your condition and test results.  What to do if you are LOW RISK for COVID-19?  Reduce your risk of any infection by using the same precautions used for avoiding the common cold or flu:  . Wash your hands often with soap and warm water for at least 20 seconds.  If soap and water are not readily available, use an alcohol-based hand sanitizer with at least 60% alcohol.  . If coughing or sneezing, cover your mouth and nose by coughing or sneezing into the elbow areas of your shirt or coat, into a tissue or into your sleeve (not your hands). . Avoid shaking hands with others and consider head nods or verbal greetings only. . Avoid touching your eyes, nose, or mouth with unwashed hands.  . Avoid close contact with people who are sick. . Avoid places or events with large numbers of people in one location, like concerts or sporting events. . Carefully consider travel plans you have or are making. . If you are planning any travel outside or inside the [place], visit the CDC's Travelers' Health webpage for the latest health notices. . If you have some symptoms but not all symptoms, continue to monitor at home and seek medical attention if your symptoms worsen. . If you are having a medical emergency, call 911.   ADDITIONAL HEALTHCARE OPTIONS FOR PATIENTS  [facility] / e-Visit: [email or web address]         [facility]: [phone]  [facility]: [phone]                   [facility]: [phone]   

## 2019-09-29 NOTE — Progress Notes (Signed)
Location of Breast Cancer: Right breast DCIS  Histology per Pathology Report: FINAL MICROSCOPIC DIAGNOSIS:   A. BREAST, RIGHT, LUMPECTOMY:  - Intermediate grade ductal carcinoma in situ with necrosis, spanning  1.5 cm.  - Margins are negative for in situ carcinoma.   Receptor Status: estrogen receptor, 100% positive with strong staining intensity and progesterone receptor, 100% positive with moderate staining intensity.  Did patient present with symptoms (if so, please note symptoms) or was this found on screening mammography?: Felicia Beasley had routine screening mammography on 01/25/2019 showing a possible abnormality in the right breast. She underwent right diagnostic mammography with tomography and right breast ultrasonography at [facility] on 02/03/2019 showing: breast density category B; probably benign 0.9 cm mass at 10 o'clock. Repeat study was recommended in 6 months.   She returned on 08/09/2019 for repeat right diagnostic mammogram and right breast ultrasound showing: breast density category B; slightly more suspicious appearance of 0.9 cm mass at 10 o'clock, difficult to determine if this corresponds to mammographic asymmetry.  Accordingly on 08/18/2019 she proceeded to biopsy of the right breast area in question. The pathology from this procedure [id number]) showed: ductal carcinoma in situ, intermediate grade, with necrosis. Prognostic indicators significant for: estrogen receptor, 100% positive with strong staining intensity and progesterone receptor, 100% positive with moderate staining intensity.  Past/Anticipated interventions by surgeon, if any: 09/08/19: Procedure: Right breast seed localized lumpectomy upper outer quadrant DCIS  Surgeon: Erroll Luna, MD  Past/Anticipated interventions by medical oncology, if any: Chemotherapy 08/31/19 Per Dr. Jana Hakim: Assessment: 59 y/o Felicia Beasley woman status post right breast upper outer quadrant biopsy  08/18/2019 showing ductal carcinoma in situ, grade 2, estrogen and progesterone receptor positive.  (1) definitive surgery pending: Consider COMET trial  (2) adjuvant radiation as appropriate  (3) antiestrogens  Lymphedema issues, if any:  Pt with good ROM. Pt denies s/s lymphedema.  Pain issues, if any: Pt denies c/o pain.  SAFETY ISSUES:  Prior radiation? no  Pacemaker/ICD? no  Possible current pregnancy?no  Is the patient on methotrexate? no  Current Complaints / other details:  Pt presents today for consult with Dr. Sondra Come for Radiation Oncology. Pt is unaccompanied.    Loma Sousa, RN 09/29/2019,10:54 AM

## 2019-10-01 ENCOUNTER — Other Ambulatory Visit (HOSPITAL_COMMUNITY): Payer: BC Managed Care – PPO

## 2019-10-05 DIAGNOSIS — Z510 Encounter for antineoplastic radiation therapy: Secondary | ICD-10-CM | POA: Diagnosis not present

## 2019-10-05 DIAGNOSIS — Z170 Estrogen receptor positive status [ER+]: Secondary | ICD-10-CM | POA: Diagnosis not present

## 2019-10-05 DIAGNOSIS — C50411 Malignant neoplasm of upper-outer quadrant of right female breast: Secondary | ICD-10-CM | POA: Insufficient documentation

## 2019-10-07 ENCOUNTER — Ambulatory Visit
Admission: RE | Admit: 2019-10-07 | Discharge: 2019-10-07 | Disposition: A | Discharge status: Home / Self Care | Payer: BC Managed Care – PPO | Source: Ambulatory Visit | Attending: Radiation Oncology | Admitting: Radiation Oncology

## 2019-10-07 DIAGNOSIS — C50411 Malignant neoplasm of upper-outer quadrant of right female breast: Secondary | ICD-10-CM | POA: Diagnosis not present

## 2019-10-08 ENCOUNTER — Other Ambulatory Visit: Payer: Self-pay

## 2019-10-08 ENCOUNTER — Ambulatory Visit
Admission: RE | Admit: 2019-10-08 | Discharge: 2019-10-08 | Disposition: A | Discharge status: Home / Self Care | Payer: BC Managed Care – PPO | Source: Ambulatory Visit | Attending: Radiation Oncology | Admitting: Radiation Oncology

## 2019-10-08 DIAGNOSIS — C50411 Malignant neoplasm of upper-outer quadrant of right female breast: Secondary | ICD-10-CM | POA: Diagnosis not present

## 2019-10-11 ENCOUNTER — Ambulatory Visit
Admission: RE | Admit: 2019-10-11 | Discharge: 2019-10-11 | Disposition: A | Discharge status: Home / Self Care | Payer: BC Managed Care – PPO | Source: Ambulatory Visit | Attending: Radiation Oncology | Admitting: Radiation Oncology

## 2019-10-11 ENCOUNTER — Other Ambulatory Visit: Payer: Self-pay

## 2019-10-11 DIAGNOSIS — C50411 Malignant neoplasm of upper-outer quadrant of right female breast: Secondary | ICD-10-CM | POA: Diagnosis not present

## 2019-10-12 ENCOUNTER — Ambulatory Visit
Admission: RE | Admit: 2019-10-12 | Discharge: 2019-10-12 | Disposition: A | Discharge status: Home / Self Care | Payer: BC Managed Care – PPO | Source: Ambulatory Visit | Attending: Radiation Oncology | Admitting: Radiation Oncology

## 2019-10-12 ENCOUNTER — Other Ambulatory Visit: Payer: Self-pay

## 2019-10-12 DIAGNOSIS — C50411 Malignant neoplasm of upper-outer quadrant of right female breast: Secondary | ICD-10-CM | POA: Diagnosis not present

## 2019-10-13 ENCOUNTER — Other Ambulatory Visit: Payer: Self-pay

## 2019-10-13 ENCOUNTER — Ambulatory Visit
Admission: RE | Admit: 2019-10-13 | Discharge: 2019-10-13 | Disposition: A | Discharge status: Home / Self Care | Payer: BC Managed Care – PPO | Source: Ambulatory Visit | Attending: Radiation Oncology | Admitting: Radiation Oncology

## 2019-10-13 DIAGNOSIS — C50411 Malignant neoplasm of upper-outer quadrant of right female breast: Secondary | ICD-10-CM | POA: Diagnosis not present

## 2019-10-14 ENCOUNTER — Ambulatory Visit
Admission: RE | Admit: 2019-10-14 | Discharge: 2019-10-14 | Disposition: A | Discharge status: Home / Self Care | Payer: BC Managed Care – PPO | Source: Ambulatory Visit | Attending: Radiation Oncology | Admitting: Radiation Oncology

## 2019-10-14 ENCOUNTER — Other Ambulatory Visit: Payer: Self-pay

## 2019-10-14 DIAGNOSIS — C50411 Malignant neoplasm of upper-outer quadrant of right female breast: Secondary | ICD-10-CM | POA: Diagnosis not present

## 2019-10-15 ENCOUNTER — Ambulatory Visit
Admission: RE | Admit: 2019-10-15 | Discharge: 2019-10-15 | Disposition: A | Discharge status: Home / Self Care | Payer: BC Managed Care – PPO | Source: Ambulatory Visit | Attending: Radiation Oncology | Admitting: Radiation Oncology

## 2019-10-15 ENCOUNTER — Other Ambulatory Visit: Payer: Self-pay

## 2019-10-15 DIAGNOSIS — C50411 Malignant neoplasm of upper-outer quadrant of right female breast: Secondary | ICD-10-CM | POA: Diagnosis not present

## 2019-10-18 ENCOUNTER — Other Ambulatory Visit: Payer: Self-pay

## 2019-10-18 ENCOUNTER — Ambulatory Visit
Admission: RE | Admit: 2019-10-18 | Discharge: 2019-10-18 | Disposition: A | Discharge status: Home / Self Care | Payer: BC Managed Care – PPO | Source: Ambulatory Visit | Attending: Radiation Oncology | Admitting: Radiation Oncology

## 2019-10-18 DIAGNOSIS — C50411 Malignant neoplasm of upper-outer quadrant of right female breast: Secondary | ICD-10-CM | POA: Diagnosis not present

## 2019-10-19 ENCOUNTER — Other Ambulatory Visit: Payer: Self-pay

## 2019-10-19 ENCOUNTER — Ambulatory Visit
Admission: RE | Admit: 2019-10-19 | Discharge: 2019-10-19 | Disposition: A | Discharge status: Home / Self Care | Payer: BC Managed Care – PPO | Source: Ambulatory Visit | Attending: Radiation Oncology | Admitting: Radiation Oncology

## 2019-10-19 DIAGNOSIS — C50411 Malignant neoplasm of upper-outer quadrant of right female breast: Secondary | ICD-10-CM | POA: Diagnosis not present

## 2019-10-20 ENCOUNTER — Other Ambulatory Visit: Payer: Self-pay

## 2019-10-20 ENCOUNTER — Ambulatory Visit
Admission: RE | Admit: 2019-10-20 | Discharge: 2019-10-20 | Disposition: A | Discharge status: Home / Self Care | Payer: BC Managed Care – PPO | Source: Ambulatory Visit | Attending: Radiation Oncology | Admitting: Radiation Oncology

## 2019-10-20 DIAGNOSIS — C50411 Malignant neoplasm of upper-outer quadrant of right female breast: Secondary | ICD-10-CM | POA: Diagnosis not present

## 2019-10-21 ENCOUNTER — Other Ambulatory Visit: Payer: Self-pay

## 2019-10-21 ENCOUNTER — Ambulatory Visit
Admission: RE | Admit: 2019-10-21 | Discharge: 2019-10-21 | Disposition: A | Discharge status: Home / Self Care | Payer: BC Managed Care – PPO | Source: Ambulatory Visit | Attending: Radiation Oncology | Admitting: Radiation Oncology

## 2019-10-21 DIAGNOSIS — C50411 Malignant neoplasm of upper-outer quadrant of right female breast: Secondary | ICD-10-CM | POA: Diagnosis not present

## 2019-10-22 ENCOUNTER — Other Ambulatory Visit: Payer: Self-pay

## 2019-10-22 ENCOUNTER — Ambulatory Visit
Admission: RE | Admit: 2019-10-22 | Discharge: 2019-10-22 | Disposition: A | Discharge status: Home / Self Care | Payer: BC Managed Care – PPO | Source: Ambulatory Visit | Attending: Radiation Oncology | Admitting: Radiation Oncology

## 2019-10-22 DIAGNOSIS — C50411 Malignant neoplasm of upper-outer quadrant of right female breast: Secondary | ICD-10-CM | POA: Diagnosis not present

## 2019-10-25 ENCOUNTER — Other Ambulatory Visit: Payer: Self-pay

## 2019-10-25 ENCOUNTER — Ambulatory Visit
Admission: RE | Admit: 2019-10-25 | Discharge: 2019-10-25 | Disposition: A | Discharge status: Home / Self Care | Payer: BC Managed Care – PPO | Source: Ambulatory Visit | Attending: Radiation Oncology | Admitting: Radiation Oncology

## 2019-10-25 DIAGNOSIS — C50411 Malignant neoplasm of upper-outer quadrant of right female breast: Secondary | ICD-10-CM | POA: Diagnosis not present

## 2019-10-26 ENCOUNTER — Ambulatory Visit
Admission: RE | Admit: 2019-10-26 | Discharge: 2019-10-26 | Disposition: A | Discharge status: Home / Self Care | Payer: BC Managed Care – PPO | Source: Ambulatory Visit | Attending: Radiation Oncology | Admitting: Radiation Oncology

## 2019-10-26 ENCOUNTER — Other Ambulatory Visit: Payer: Self-pay

## 2019-10-26 DIAGNOSIS — C50411 Malignant neoplasm of upper-outer quadrant of right female breast: Secondary | ICD-10-CM | POA: Diagnosis not present

## 2019-10-27 ENCOUNTER — Ambulatory Visit
Admission: RE | Admit: 2019-10-27 | Discharge: 2019-10-27 | Disposition: A | Discharge status: Home / Self Care | Payer: BC Managed Care – PPO | Source: Ambulatory Visit | Attending: Radiation Oncology | Admitting: Radiation Oncology

## 2019-10-27 ENCOUNTER — Other Ambulatory Visit: Payer: Self-pay

## 2019-10-27 DIAGNOSIS — C50411 Malignant neoplasm of upper-outer quadrant of right female breast: Secondary | ICD-10-CM | POA: Diagnosis not present

## 2019-10-28 ENCOUNTER — Other Ambulatory Visit: Payer: Self-pay

## 2019-10-28 ENCOUNTER — Ambulatory Visit
Admission: RE | Admit: 2019-10-28 | Discharge: 2019-10-28 | Disposition: A | Discharge status: Home / Self Care | Payer: BC Managed Care – PPO | Source: Ambulatory Visit | Attending: Radiation Oncology | Admitting: Radiation Oncology

## 2019-10-28 DIAGNOSIS — C50411 Malignant neoplasm of upper-outer quadrant of right female breast: Secondary | ICD-10-CM | POA: Diagnosis not present

## 2019-10-29 ENCOUNTER — Ambulatory Visit
Admission: RE | Admit: 2019-10-29 | Discharge: 2019-10-29 | Disposition: A | Discharge status: Home / Self Care | Payer: BC Managed Care – PPO | Source: Ambulatory Visit | Attending: Radiation Oncology | Admitting: Radiation Oncology

## 2019-10-29 ENCOUNTER — Other Ambulatory Visit: Payer: Self-pay

## 2019-10-29 DIAGNOSIS — C50411 Malignant neoplasm of upper-outer quadrant of right female breast: Secondary | ICD-10-CM | POA: Diagnosis not present

## 2019-11-01 ENCOUNTER — Ambulatory Visit
Admission: RE | Admit: 2019-11-01 | Discharge: 2019-11-01 | Disposition: A | Discharge status: Home / Self Care | Payer: BC Managed Care – PPO | Source: Ambulatory Visit | Attending: Radiation Oncology | Admitting: Radiation Oncology

## 2019-11-01 ENCOUNTER — Other Ambulatory Visit: Payer: Self-pay

## 2019-11-01 DIAGNOSIS — Z510 Encounter for antineoplastic radiation therapy: Secondary | ICD-10-CM | POA: Insufficient documentation

## 2019-11-01 DIAGNOSIS — Z170 Estrogen receptor positive status [ER+]: Secondary | ICD-10-CM | POA: Diagnosis present

## 2019-11-01 DIAGNOSIS — C50411 Malignant neoplasm of upper-outer quadrant of right female breast: Secondary | ICD-10-CM | POA: Insufficient documentation

## 2019-11-02 ENCOUNTER — Other Ambulatory Visit: Payer: Self-pay

## 2019-11-02 ENCOUNTER — Ambulatory Visit: Payer: BC Managed Care – PPO

## 2019-11-02 ENCOUNTER — Ambulatory Visit
Admission: RE | Admit: 2019-11-02 | Discharge: 2019-11-02 | Disposition: A | Discharge status: Home / Self Care | Payer: BC Managed Care – PPO | Source: Ambulatory Visit | Attending: Radiation Oncology | Admitting: Radiation Oncology

## 2019-11-02 DIAGNOSIS — C50411 Malignant neoplasm of upper-outer quadrant of right female breast: Secondary | ICD-10-CM | POA: Diagnosis not present

## 2019-11-03 ENCOUNTER — Other Ambulatory Visit: Payer: Self-pay

## 2019-11-03 ENCOUNTER — Ambulatory Visit
Admission: RE | Admit: 2019-11-03 | Discharge: 2019-11-03 | Disposition: A | Discharge status: Home / Self Care | Payer: BC Managed Care – PPO | Source: Ambulatory Visit | Attending: Radiation Oncology | Admitting: Radiation Oncology

## 2019-11-03 DIAGNOSIS — C50411 Malignant neoplasm of upper-outer quadrant of right female breast: Secondary | ICD-10-CM | POA: Diagnosis not present

## 2019-11-04 ENCOUNTER — Ambulatory Visit
Admission: RE | Admit: 2019-11-04 | Discharge: 2019-11-04 | Disposition: A | Discharge status: Home / Self Care | Payer: BC Managed Care – PPO | Source: Ambulatory Visit | Attending: Radiation Oncology | Admitting: Radiation Oncology

## 2019-11-04 ENCOUNTER — Other Ambulatory Visit: Payer: Self-pay

## 2019-11-04 DIAGNOSIS — C50411 Malignant neoplasm of upper-outer quadrant of right female breast: Secondary | ICD-10-CM | POA: Diagnosis not present

## 2019-11-05 ENCOUNTER — Ambulatory Visit
Admission: RE | Admit: 2019-11-05 | Discharge: 2019-11-05 | Disposition: A | Discharge status: Home / Self Care | Payer: BC Managed Care – PPO | Source: Ambulatory Visit | Attending: Radiation Oncology | Admitting: Radiation Oncology

## 2019-11-05 ENCOUNTER — Other Ambulatory Visit: Payer: Self-pay

## 2019-11-05 DIAGNOSIS — C50411 Malignant neoplasm of upper-outer quadrant of right female breast: Secondary | ICD-10-CM | POA: Diagnosis not present

## 2019-11-08 ENCOUNTER — Other Ambulatory Visit: Payer: Self-pay

## 2019-11-08 ENCOUNTER — Ambulatory Visit
Admission: RE | Admit: 2019-11-08 | Discharge: 2019-11-08 | Disposition: A | Discharge status: Home / Self Care | Payer: BC Managed Care – PPO | Source: Ambulatory Visit | Attending: Radiation Oncology | Admitting: Radiation Oncology

## 2019-11-08 DIAGNOSIS — C50411 Malignant neoplasm of upper-outer quadrant of right female breast: Secondary | ICD-10-CM | POA: Diagnosis not present

## 2019-11-08 NOTE — Progress Notes (Signed)
[place]  [phone] Fax:(336) [phone]     ID: Felicia Beasley DOB: [birth year]  MR#: [id number]  [id number]  Patient Care Team: Harlan Stains, MD as PCP - General (Family Medicine) Mauro Kaufmann, RN as Oncology Nurse Navigator Rockwell Germany, RN as Oncology Nurse Navigator Erroll Luna, MD as Consulting Physician (General Surgery) Sean Malinowski, Virgie Dad, MD as Consulting Physician (Oncology) Newton Pigg, MD as Consulting Physician (Obstetrics and Gynecology) Gery Pray, MD as Consulting Physician (Radiation Oncology) Clarene Essex, Counselor (Genetic Counselor) Chauncey Cruel, MD OTHER MD:  CHIEF COMPLAINT: noninvasive breast cancer  CURRENT TREATMENT: To start anastrozole   INTERVAL HISTORY: Felicia Beasley returns today for follow up of her noninvasive breast cancer. She was evaluated in the breast cancer clinic on 08/31/2019.  She opted to proceed with right lumpectomy on 09/08/2019 under Dr. Brantley Stage. 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  She underwent genetic counseling on 09/09/2019. At that time, she opted to take additional time to consider whether or not to pursue genetic testing.  She was referred to Dr. Sondra Come on 09/29/2019 for adjuvant radiation therapy. She began treatment on 10/07/2019 and is scheduled through 11/22/2019.   REVIEW OF SYSTEMS: Felicia Beasley did remarkably well with her surgery, with no unusual pain fever bleeding or other complications.  She is also doing well with radiation.  She is exercising regularly, particularly likes doing yoga, and is looking forward to going back to the gym.  She was scheduled for genetics testing but decided it was too much right now and she would like to postpone that.  She is looking forward to her new grandson or grandchild who will be born in [place] sometime in August.  A detailed  review of systems today was otherwise stable    HISTORY OF CURRENT ILLNESS: From the original intake note:  Felicia Beasley had routine screening mammography on 01/25/2019 showing a possible abnormality in the right breast. She underwent right diagnostic mammography with tomography and right breast ultrasonography at [facility] on 02/03/2019 showing: breast density category B; probably benign 0.9 cm mass at 10 o'clock. Repeat study was recommended in 6 months.   She returned on 08/09/2019 for repeat right diagnostic mammogram and right breast ultrasound showing: breast density category B; slightly more suspicious appearance of 0.9 cm mass at 10 o'clock, difficult to determine if this corresponds to mammographic asymmetry.  Accordingly on 08/18/2019 she proceeded to biopsy of the right breast area in question. The pathology from this procedure [id number]) showed: ductal carcinoma in situ, intermediate grade, with necrosis. Prognostic indicators significant for: estrogen receptor, 100% positive with strong staining intensity and progesterone receptor, 100% positive with moderate staining intensity.  She is scheduled for right lumpectomy on 09/08/2019 under Dr. Brantley Stage.  The patient's subsequent history is as detailed below.   PAST MEDICAL HISTORY: Past Medical History:  Diagnosis Date  . Breast cancer ([place])   . Edema leg   . Family history of ovarian cancer   . History of recurrent UTIs   . Migraine headache   . Sinusitis   . Venous insufficiency     PAST SURGICAL HISTORY: Past Surgical History:  Procedure Laterality Date  . ABDOMINAL HYSTERECTOMY Bilateral 04/11/2009   w/ BSO; for menorrhagia and fibroids  . BREAST LUMPECTOMY WITH RADIOACTIVE SEED LOCALIZATION Right 09/08/2019   Procedure: RIGHT BREAST LUMPECTOMY WITH RADIOACTIVE SEED LOCALIZATION;  Surgeon: Erroll Luna, MD;  Location: [facility];  Service: General;  Laterality: Right;  . CESAREAN SECTION       FAMILY HISTORY: Family History  Problem Relation Age of Onset  . Arthritis Mother   . Diabetes Mother   . Arthritis Father   . Ovarian cancer Half-Sister 67  . Emphysema Maternal Grandfather        smoker   [name] is 36 years old and her mother is 36 years old as of March 2021.  The patient has 2 brothers and 2 sisters, all half siblings.  One sister had ovarian cancer diagnosed at age 16.  There is no other history of cancer in the family to the patient's knowledge   GYNECOLOGIC HISTORY:  Patient's last menstrual period was 04/11/2009 (exact date). Menarche: 59 years old Age at first live birth: 59 years old [place] P 3 LMP 2010 Contraceptive more than 1 year, no complications HRT no  Hysterectomy? Yes, for menorrhagia and fibroids BSO? 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  Her husband  manages a [employer] location and is Company secretary of the [employer] which the patient attends.  Daughter Janett Billow 55 in [place] works for the [employer] son Corene Cornea 49 in [place] works for [employer] and is very much involved in Geologist, engineering.  Daughter Anderson Malta 24 in [place] is a homemaker.  The patient has 2 grandchildren and a third 1 due August 2021.    ADVANCED DIRECTIVES: In the absence of documents to the contrary the patient's husband is her healthcare power of attorney   HEALTH MAINTENANCE: Social History   Tobacco Use  . Smoking status: Never Smoker  . Smokeless tobacco: Never Used  Substance Use Topics  . Alcohol use: No  . Drug use: No     Colonoscopy: 2010/[facility]  PAP: Per Dr. Ulanda Edison  Bone density: none on file   Allergies  Allergen Reactions  . Ciprocinonide [Fluocinolone]     nausea  . Macrobid [Nitrofurantoin Macrocrystal]     migraine    Current Outpatient Medications  Medication Sig Dispense Refill  . Ascorbic Acid (VITAMIN C) 100 MG tablet Take 100 mg by mouth  daily.    Marland Kitchen aspirin EC 81 MG tablet Take 81 mg by mouth daily.    . Cranberry 1000 MG CAPS Take by mouth.    Marland Kitchen HYDROcodone-acetaminophen (NORCO/VICODIN) 5-325 MG tablet Take 1 tablet by mouth every 6 (six) hours as needed for moderate pain. 15 tablet 0  . ibuprofen (ADVIL) 800 MG tablet Take 1 tablet (800 mg total) by mouth every 8 (eight) hours as needed. 30 tablet 0  . Omega-3 Fatty Acids (FISH OIL) 1000 MG CAPS Fish Oil    . Turmeric 400 MG CAPS Take by mouth.     No current facility-administered medications for this visit.    OBJECTIVE: African-American woman in no acute distress  Vitals:   11/09/19 1155  BP: (!) 140/53  Pulse: 76  Resp: 20  Temp: 98.3 F (36.8 C)  SpO2: 100%     Body mass index is 34.86 kg/m.   Wt Readings from Last 3 Encounters:  11/09/19 216 lb (98 kg)  09/29/19 214 lb 6.4 oz (97.3 kg)  09/08/19 209 lb 7 oz (95 kg)      ECOG FS:1 - Symptomatic but completely ambulatory  Sclerae unicteric, EOMs intact Wearing a mask No cervical or supraclavicular adenopathy Lungs no rales or rhonchi Heart regular rate and rhythm  Abd soft, nontender, positive bowel sounds MSK no focal spinal tenderness, no upper extremity lymphedema, good right upper extremity range of motion Neuro: nonfocal, well oriented, appropriate affect Breasts: The right breast is status post lumpectomy and is currently receiving radiation.  There is mild hyperpigmentation.  The cosmetic result is excellent.  There is no evidence of residual or recurrent disease.  Left breast is benign.  Both axillae are benign.   LAB RESULTS:  CMP     Component Value Date/Time   NA 142 08/31/2019 1526   K 4.0 08/31/2019 1526   CL 106 08/31/2019 1526   CO2 28 08/31/2019 1526   GLUCOSE 92 08/31/2019 1526   BUN 13 08/31/2019 1526   CREATININE 0.87 08/31/2019 1526   CALCIUM 8.9 08/31/2019 1526   PROT 7.8 08/31/2019 1526   ALBUMIN 3.9 08/31/2019 1526   AST 14 (L) 08/31/2019 1526   ALT 13 08/31/2019  1526   ALKPHOS 71 08/31/2019 1526   BILITOT 0.2 (L) 08/31/2019 1526   GFRNONAA >60 08/31/2019 1526   GFRAA >60 08/31/2019 1526    No results found for: TOTALPROTELP, ALBUMINELP, A1GS, A2GS, BETS, BETA2SER, GAMS, MSPIKE, SPEI  Lab Results  Component Value Date   WBC 8.0 08/31/2019   NEUTROABS 4.9 08/31/2019   HGB 12.1 08/31/2019   HCT 38.4 08/31/2019   MCV 93.2 08/31/2019   PLT 257 08/31/2019    No results found for: LABCA2  No components found for: [id number]  No results for input(s): INR in the last 168 hours.  No results found for: LABCA2  No results found for: [id number]  No results found for: [id number]  No results found for: [id number]  No results found for: CA2729  No components found for: HGQUANT  No results found for: CEA1 / No results found for: CEA1   No results found for: AFPTUMOR  No results found for: CHROMOGRNA  No results found for: KPAFRELGTCHN, LAMBDASER, KAPLAMBRATIO (kappa/lambda light chains)  No results found for: HGBA, HGBA2QUANT, HGBFQUANT, HGBSQUAN (Hemoglobinopathy evaluation)   No results found for: LDH  No results found for: IRON, TIBC, IRONPCTSAT (Iron and TIBC)  No results found for: FERRITIN  Urinalysis No results found for: COLORURINE, APPEARANCEUR, LABSPEC, PHURINE, GLUCOSEU, HGBUR, BILIRUBINUR, KETONESUR, PROTEINUR, UROBILINOGEN, NITRITE, LEUKOCYTESUR   STUDIES: No results found.   ELIGIBLE FOR AVAILABLE RESEARCH PROTOCOL: not a COMET candidate (mass on imaging)  ASSESSMENT: 59 y.o. [place] woman status post right breast upper outer quadrant biopsy 08/18/2019 showing ductal carcinoma in situ, grade 2, estrogen and progesterone receptor positive.  (1)  status post right lumpectomy 09/08/2019 for ductal carcinoma in situ, grade 2, measuring 1.5 cm, with negative margins.  (2) adjuvant radiation 09/29/2019 - 11/22/2019  (3) genetics testing pending  (4) to start anastrozole 01/03/2020   PLAN:  Felicia Beasley  had no problems with her surgery and is tolerating radiation also remarkably well.  Today we reviewed her pathology.  We reviewed the meaning of clear margins.  We noted the possibility of skip lesions which is the reason she is receiving radiation and we noted that radiation does a terrific job at lowering the risk of local recurrence which will be less than 10% in her case.  If she takes an antiestrogen for 5 years that already low risk would be cut in half.  More importantly however her risk of developing another breast cancer also would be cut in half.  That risk approaches 1 %/year.  We discussed the possible toxicities side effects and complications  of anastrozole.  She is willing to give it a try which is a very reasonable attitude.  We are going to wait until July to start so she has plenty of time to recover from her radiation treatments.  She will then see me in February, but we will have a virtual visit in October just to make sure she is tolerating it well.  Genetics was too much for her with all that is going on right now but when she returns to see me in February we can have her meet with a genetics counselor and we will draw the blood for the appropriate test.  We discussed a referral to physical therapy but she feels she does not need that at present.  She knows to call for any other issue that may develop before the next visit.  Total encounter time 30 minutes.Chauncey Cruel, MD   11/09/2019 12:10 PM Medical Oncology and Hematology [facility] [address], [place] [address] Tel. [phone]    Fax. [phone]   This document serves as a record of services personally performed by Lurline Del, MD. It was created on his behalf by Wilburn Mylar, a trained medical scribe. The creation of this record is based on the scribe's personal observations and the provider's statements to them.   I, Lurline Del MD, have reviewed the above  documentation for accuracy and completeness, and I agree with the above.   *Total Encounter Time as defined by the Centers for Medicare and Medicaid Services includes, in addition to the face-to-face time of a patient visit (documented in the note above) non-face-to-face time: obtaining and reviewing outside history, ordering and reviewing medications, tests or procedures, care coordination (communications with other health care professionals or caregivers) and documentation in the medical record.

## 2019-11-09 ENCOUNTER — Inpatient Hospital Stay: Payer: BC Managed Care – PPO | Attending: Oncology | Admitting: Oncology

## 2019-11-09 ENCOUNTER — Other Ambulatory Visit: Payer: Self-pay

## 2019-11-09 ENCOUNTER — Ambulatory Visit: Payer: BC Managed Care – PPO | Admitting: Radiation Oncology

## 2019-11-09 ENCOUNTER — Ambulatory Visit
Admission: RE | Admit: 2019-11-09 | Discharge: 2019-11-09 | Disposition: A | Discharge status: Home / Self Care | Payer: BC Managed Care – PPO | Source: Ambulatory Visit | Attending: Radiation Oncology | Admitting: Radiation Oncology

## 2019-11-09 VITALS — BP 140/53 | HR 76 | Temp 98.3°F | Resp 20 | Ht 66.0 in | Wt 216.0 lb

## 2019-11-09 DIAGNOSIS — Z79811 Long term (current) use of aromatase inhibitors: Secondary | ICD-10-CM | POA: Diagnosis not present

## 2019-11-09 DIAGNOSIS — Z8261 Family history of arthritis: Secondary | ICD-10-CM | POA: Diagnosis not present

## 2019-11-09 DIAGNOSIS — Z79899 Other long term (current) drug therapy: Secondary | ICD-10-CM | POA: Diagnosis not present

## 2019-11-09 DIAGNOSIS — C50411 Malignant neoplasm of upper-outer quadrant of right female breast: Secondary | ICD-10-CM | POA: Diagnosis not present

## 2019-11-09 DIAGNOSIS — Z923 Personal history of irradiation: Secondary | ICD-10-CM | POA: Diagnosis not present

## 2019-11-09 DIAGNOSIS — Z8041 Family history of malignant neoplasm of ovary: Secondary | ICD-10-CM | POA: Diagnosis not present

## 2019-11-09 DIAGNOSIS — I872 Venous insufficiency (chronic) (peripheral): Secondary | ICD-10-CM | POA: Diagnosis not present

## 2019-11-09 DIAGNOSIS — Z791 Long term (current) use of non-steroidal anti-inflammatories (NSAID): Secondary | ICD-10-CM | POA: Diagnosis not present

## 2019-11-09 DIAGNOSIS — Z7982 Long term (current) use of aspirin: Secondary | ICD-10-CM | POA: Diagnosis not present

## 2019-11-09 DIAGNOSIS — Z170 Estrogen receptor positive status [ER+]: Secondary | ICD-10-CM | POA: Diagnosis not present

## 2019-11-09 DIAGNOSIS — Z833 Family history of diabetes mellitus: Secondary | ICD-10-CM | POA: Diagnosis not present

## 2019-11-09 DIAGNOSIS — Z90710 Acquired absence of both cervix and uterus: Secondary | ICD-10-CM | POA: Diagnosis not present

## 2019-11-09 DIAGNOSIS — D0511 Intraductal carcinoma in situ of right breast: Secondary | ICD-10-CM | POA: Diagnosis present

## 2019-11-09 MED ORDER — ANASTROZOLE 1 MG PO TABS: 1.0000 mg | ORAL_TABLET | Freq: Every day | ORAL | 4 refills | Status: DC

## 2019-11-10 ENCOUNTER — Other Ambulatory Visit: Payer: Self-pay

## 2019-11-10 ENCOUNTER — Telehealth: Payer: Self-pay | Admitting: Oncology

## 2019-11-10 ENCOUNTER — Ambulatory Visit
Admission: RE | Admit: 2019-11-10 | Discharge: 2019-11-10 | Disposition: A | Discharge status: Home / Self Care | Payer: BC Managed Care – PPO | Source: Ambulatory Visit | Attending: Radiation Oncology | Admitting: Radiation Oncology

## 2019-11-10 DIAGNOSIS — C50411 Malignant neoplasm of upper-outer quadrant of right female breast: Secondary | ICD-10-CM | POA: Diagnosis not present

## 2019-11-10 NOTE — Telephone Encounter (Signed)
Scheduled appts per 5/11 los. Pt stated she would call me back to go over appts.

## 2019-11-11 ENCOUNTER — Ambulatory Visit
Admission: RE | Admit: 2019-11-11 | Discharge: 2019-11-11 | Disposition: A | Discharge status: Home / Self Care | Payer: BC Managed Care – PPO | Source: Ambulatory Visit | Attending: Radiation Oncology | Admitting: Radiation Oncology

## 2019-11-11 ENCOUNTER — Other Ambulatory Visit: Payer: Self-pay

## 2019-11-11 DIAGNOSIS — C50411 Malignant neoplasm of upper-outer quadrant of right female breast: Secondary | ICD-10-CM | POA: Diagnosis not present

## 2019-11-12 ENCOUNTER — Ambulatory Visit
Admission: RE | Admit: 2019-11-12 | Discharge: 2019-11-12 | Disposition: A | Discharge status: Home / Self Care | Payer: BC Managed Care – PPO | Source: Ambulatory Visit | Attending: Radiation Oncology | Admitting: Radiation Oncology

## 2019-11-12 ENCOUNTER — Other Ambulatory Visit: Payer: Self-pay

## 2019-11-12 DIAGNOSIS — C50411 Malignant neoplasm of upper-outer quadrant of right female breast: Secondary | ICD-10-CM | POA: Diagnosis not present

## 2019-11-15 ENCOUNTER — Other Ambulatory Visit: Payer: Self-pay

## 2019-11-15 ENCOUNTER — Ambulatory Visit
Admission: RE | Admit: 2019-11-15 | Discharge: 2019-11-15 | Disposition: A | Discharge status: Home / Self Care | Payer: BC Managed Care – PPO | Source: Ambulatory Visit | Attending: Radiation Oncology | Admitting: Radiation Oncology

## 2019-11-15 DIAGNOSIS — C50411 Malignant neoplasm of upper-outer quadrant of right female breast: Secondary | ICD-10-CM | POA: Diagnosis not present

## 2019-11-16 ENCOUNTER — Other Ambulatory Visit: Payer: Self-pay

## 2019-11-16 ENCOUNTER — Ambulatory Visit: Payer: BC Managed Care – PPO

## 2019-11-16 ENCOUNTER — Ambulatory Visit
Admission: RE | Admit: 2019-11-16 | Discharge: 2019-11-16 | Disposition: A | Discharge status: Home / Self Care | Payer: BC Managed Care – PPO | Source: Ambulatory Visit | Attending: Radiation Oncology | Admitting: Radiation Oncology

## 2019-11-16 DIAGNOSIS — C50411 Malignant neoplasm of upper-outer quadrant of right female breast: Secondary | ICD-10-CM | POA: Diagnosis not present

## 2019-11-17 ENCOUNTER — Ambulatory Visit
Admission: RE | Admit: 2019-11-17 | Discharge: 2019-11-17 | Disposition: A | Discharge status: Home / Self Care | Payer: BC Managed Care – PPO | Source: Ambulatory Visit | Attending: Radiation Oncology | Admitting: Radiation Oncology

## 2019-11-17 ENCOUNTER — Ambulatory Visit: Payer: BC Managed Care – PPO

## 2019-11-17 ENCOUNTER — Other Ambulatory Visit: Payer: Self-pay

## 2019-11-17 DIAGNOSIS — C50411 Malignant neoplasm of upper-outer quadrant of right female breast: Secondary | ICD-10-CM | POA: Diagnosis not present

## 2019-11-18 ENCOUNTER — Other Ambulatory Visit: Payer: Self-pay

## 2019-11-18 ENCOUNTER — Ambulatory Visit
Admission: RE | Admit: 2019-11-18 | Discharge: 2019-11-18 | Disposition: A | Discharge status: Home / Self Care | Payer: BC Managed Care – PPO | Source: Ambulatory Visit | Attending: Radiation Oncology | Admitting: Radiation Oncology

## 2019-11-18 DIAGNOSIS — C50411 Malignant neoplasm of upper-outer quadrant of right female breast: Secondary | ICD-10-CM | POA: Diagnosis not present

## 2019-11-19 ENCOUNTER — Other Ambulatory Visit: Payer: Self-pay

## 2019-11-19 ENCOUNTER — Ambulatory Visit
Admission: RE | Admit: 2019-11-19 | Discharge: 2019-11-19 | Disposition: A | Discharge status: Home / Self Care | Payer: BC Managed Care – PPO | Source: Ambulatory Visit | Attending: Radiation Oncology | Admitting: Radiation Oncology

## 2019-11-19 DIAGNOSIS — C50411 Malignant neoplasm of upper-outer quadrant of right female breast: Secondary | ICD-10-CM | POA: Diagnosis not present

## 2019-11-22 ENCOUNTER — Ambulatory Visit
Admission: RE | Admit: 2019-11-22 | Discharge: 2019-11-22 | Disposition: A | Discharge status: Home / Self Care | Payer: BC Managed Care – PPO | Source: Ambulatory Visit | Attending: Radiation Oncology | Admitting: Radiation Oncology

## 2019-11-22 ENCOUNTER — Ambulatory Visit: Payer: BC Managed Care – PPO

## 2019-11-22 ENCOUNTER — Encounter: Payer: Self-pay | Admitting: Radiation Oncology

## 2019-11-22 ENCOUNTER — Encounter: Payer: Self-pay | Admitting: *Deleted

## 2019-11-22 ENCOUNTER — Other Ambulatory Visit: Payer: Self-pay

## 2019-11-22 DIAGNOSIS — C50411 Malignant neoplasm of upper-outer quadrant of right female breast: Secondary | ICD-10-CM | POA: Diagnosis not present

## 2019-11-22 MED ORDER — SILVER SULFADIAZINE 1 % EX CREA: TOPICAL_CREAM | Freq: Every day | CUTANEOUS | Status: DC

## 2019-11-23 ENCOUNTER — Ambulatory Visit: Payer: BC Managed Care – PPO

## 2019-11-26 ENCOUNTER — Telehealth: Payer: Self-pay | Admitting: *Deleted

## 2019-11-26 ENCOUNTER — Encounter: Payer: Self-pay | Admitting: Radiation Oncology

## 2019-11-26 NOTE — Telephone Encounter (Signed)
Called patient to inform that Dr. Sondra Come wants to see her on 11-30-19 and he wanted me to keep the 01-06-20 fu as well, lvm stating that fact

## 2019-11-30 ENCOUNTER — Ambulatory Visit: Payer: BC Managed Care – PPO | Admitting: Radiation Oncology

## 2019-12-27 NOTE — Progress Notes (Incomplete)
  Patient Name: Felicia Beasley MRN: [id number] DOB: 07/03/1960 Referring Physician: Harlan Stains (Profile Not Attached) Date of Service: 11/22/2019 [facility], [place]                                                        End Of Treatment Note  Diagnoses: C50.411-Malignant neoplasm of upper-outer quadrant of right female breast  Cancer Staging: Stage 0, Right Breast UOQ, DCIS, ER+ / PR+, Grade 2  Intent: Curative  Radiation Treatment Dates: 10/07/2019 through 11/22/2019 Site Technique Total Dose (Gy) Dose per Fx (Gy) Completed Fx Beam Energies  Breast, Right: Breast_Rt_Bst 3D 10/10 2 5/5 6X, 10X  Breast, Right: Breast_Rt 3D 50.4/50.4 1.8 28/28 10X   Narrative: The patient tolerated radiation therapy relatively well. She did report some occasional sharp shooting pain the right breast along with some increased sleepiness and occasional fatigue. During the second week of treatment, the right breast was slightly hyperpigmented and swollen. As treatment continued, the right breast area showed hyperpigmentation changes without any skin breakdown. On 11/02/2019, there was noted to be a small boil underneath the left breast medial inframammary fold. There were no signs of infection of drainage at that time. She was advised to place a warm compress on that area. Towards the end of treatment, the patient was noted to have minimal skin breakdown in the right inframammary fold and axillary region that progressed to some dry desquamation and then minimal moist desquamation.  Plan: The patient will follow-up with radiation oncology in eight days to assess her moist desquamation. She will continue using the Sonofine and was also given Silvadene.  ________________________________________________   Blair Promise, PhD, MD  This  document serves as a record of services personally performed by Gery Pray, MD. It was created on his behalf by Clerance Lav, a trained medical scribe. The creation of this record is based on the scribe's personal observations and the provider's statements to them. This document has been checked and approved by the attending provider.

## 2020-01-05 NOTE — Progress Notes (Signed)
Radiation Oncology         [phone]) [phone] ________________________________  Name: Felicia Beasley MRN: [id number]  Date: 01/06/2020  DOB: [birth year]  Follow-Up Visit Note  CC: Harlan Stains, MD  Harlan Stains, MD    ICD-10-CM   1. Malignant neoplasm of upper-outer quadrant of right breast in female, estrogen receptor positive ([place])  C50.411    Z17.0     Diagnosis: Stage0, RightBreast UOQ,DCIS, ER+/ PR+,Grade2  Interval Since Last Radiation: One month and two weeks.  Radiation Treatment Dates: 10/07/2019 through 11/22/2019 Site Technique Total Dose (Gy) Dose per Fx (Gy) Completed Fx Beam Energies  Breast, Right: Breast_Rt_Bst 3D 10/10 2 5/5 6X, 10X  Breast, Right: Breast_Rt 3D 50.4/50.4 1.8 28/28 10X   Narrative:  The patient returns today for routine follow-up. No significant interval history since the end of treatment.   On review of systems, she reports minimal fatigue at this time. She denies pain within the right breast area nipple discharge or bleeding..                            ALLERGIES:  is allergic to ciprocinonide [fluocinolone] and macrobid [nitrofurantoin macrocrystal].  Meds: Current Outpatient Medications  Medication Sig Dispense Refill  . anastrozole (ARIMIDEX) 1 MG tablet Take 1 tablet (1 mg total) by mouth daily. Start 01/03/2020 90 tablet 4  . Ascorbic Acid (VITAMIN C) 100 MG tablet Take 100 mg by mouth daily.    Marland Kitchen aspirin EC 81 MG tablet Take 81 mg by mouth daily.    . Cranberry 1000 MG CAPS Take by mouth.    Marland Kitchen ibuprofen (ADVIL) 800 MG tablet Take 1 tablet (800 mg total) by mouth every 8 (eight) hours as needed. 30 tablet 0  . Omega-3 Fatty Acids (FISH OIL) 1000 MG CAPS Fish Oil    . Turmeric 400 MG CAPS Take by mouth.     No current facility-administered medications for this encounter.    Physical Findings: The patient is in no acute distress. Patient is alert and oriented.  height is 5\' 6"  (1.676 m). Her oral temperature is 98.4 F (36.9  C). Her blood pressure is 123/71 and her pulse is 66. Her respiration is 18 and oxygen saturation is 100%.    Lungs are clear to auscultation bilaterally. Heart has regular rate and rhythm. No palpable cervical, supraclavicular, or axillary adenopathy. Abdomen soft, non-tender, normal bowel sounds. Left breast: No palpable mass, nipple discharge, or bleeding.  Right breast: Moderate hyperpigmentation changes noted.  Minimal swelling noted.  No dominant mass appreciated in the breast nipple discharge or bleeding.  Skin is well-healed  Lab Findings: Lab Results  Component Value Date   WBC 8.0 08/31/2019   HGB 12.1 08/31/2019   HCT 38.4 08/31/2019   MCV 93.2 08/31/2019   PLT 257 08/31/2019    Radiographic Findings: No results found.  Impression: Stage0, RightBreast UOQ,DCIS, ER+/ PR+,Grade2  The patient is recovering well from her radiation therapy.  No evidence of recurrence on clinical exam today.  Plan: The patient will follow-up with radiation oncology in as-needed basis. She will start Arimidex soon.    ____________________________________   Blair Promise, PhD, MD  This document serves as a record of services personally performed by Gery Pray, MD. It was created on his behalf by Clerance Lav, a trained medical scribe. The creation of this record is based on the scribe's personal observations and the provider's statements to them. This document  has been checked and approved by the attending provider.

## 2020-01-06 ENCOUNTER — Ambulatory Visit
Admission: RE | Admit: 2020-01-06 | Discharge: 2020-01-06 | Disposition: A | Discharge status: Home / Self Care | Payer: BC Managed Care – PPO | Source: Ambulatory Visit | Attending: Radiation Oncology | Admitting: Radiation Oncology

## 2020-01-06 ENCOUNTER — Encounter: Payer: Self-pay | Admitting: Radiation Oncology

## 2020-01-06 ENCOUNTER — Ambulatory Visit: Payer: Self-pay | Admitting: Radiation Oncology

## 2020-01-06 ENCOUNTER — Other Ambulatory Visit: Payer: Self-pay

## 2020-01-06 VITALS — BP 123/71 | HR 66 | Temp 98.4°F | Resp 18 | Ht 66.0 in

## 2020-01-06 DIAGNOSIS — R5383 Other fatigue: Secondary | ICD-10-CM | POA: Insufficient documentation

## 2020-01-06 DIAGNOSIS — Z7982 Long term (current) use of aspirin: Secondary | ICD-10-CM | POA: Insufficient documentation

## 2020-01-06 DIAGNOSIS — C50411 Malignant neoplasm of upper-outer quadrant of right female breast: Secondary | ICD-10-CM | POA: Diagnosis present

## 2020-01-06 DIAGNOSIS — Z170 Estrogen receptor positive status [ER+]: Secondary | ICD-10-CM | POA: Diagnosis not present

## 2020-01-06 DIAGNOSIS — Z923 Personal history of irradiation: Secondary | ICD-10-CM | POA: Insufficient documentation

## 2020-01-06 DIAGNOSIS — Z79899 Other long term (current) drug therapy: Secondary | ICD-10-CM | POA: Diagnosis not present

## 2020-01-06 DIAGNOSIS — Z79811 Long term (current) use of aromatase inhibitors: Secondary | ICD-10-CM | POA: Diagnosis not present

## 2020-01-06 NOTE — Progress Notes (Signed)
Patient here for a 1 month f/u with Dr. Sondra Come. Patient denies any issues with her right breast or right arm.  BP 123/71 (BP Location: Left Arm, Patient Position: Sitting)   Pulse 66   Temp 98.4 F (36.9 C) (Oral)   Resp 18   Ht 5\' 6"  (1.676 m)   LMP 04/11/2009 (Exact Date)   SpO2 100%   BMI 34.86 kg/m   Wt Readings from Last 3 Encounters:  11/09/19 216 lb (98 kg)  09/29/19 214 lb 6.4 oz (97.3 kg)  09/08/19 209 lb 7 oz (95 kg)

## 2020-03-29 ENCOUNTER — Other Ambulatory Visit: Payer: Self-pay | Admitting: Surgery

## 2020-03-29 DIAGNOSIS — Z08 Encounter for follow-up examination after completed treatment for malignant neoplasm: Secondary | ICD-10-CM

## 2020-04-18 ENCOUNTER — Ambulatory Visit
Admission: RE | Admit: 2020-04-18 | Discharge: 2020-04-18 | Disposition: A | Discharge status: Home / Self Care | Payer: BC Managed Care – PPO | Source: Ambulatory Visit | Attending: Surgery | Admitting: Surgery

## 2020-04-18 ENCOUNTER — Other Ambulatory Visit: Payer: Self-pay

## 2020-04-18 DIAGNOSIS — Z08 Encounter for follow-up examination after completed treatment for malignant neoplasm: Secondary | ICD-10-CM

## 2020-04-18 DIAGNOSIS — Z86000 Personal history of in-situ neoplasm of breast: Secondary | ICD-10-CM

## 2020-04-18 HISTORY — DX: Personal history of irradiation: Z92.3

## 2020-04-24 DIAGNOSIS — E785 Hyperlipidemia, unspecified: Secondary | ICD-10-CM | POA: Insufficient documentation

## 2020-04-24 DIAGNOSIS — R7303 Prediabetes: Secondary | ICD-10-CM | POA: Insufficient documentation

## 2020-04-24 DIAGNOSIS — G5602 Carpal tunnel syndrome, left upper limb: Secondary | ICD-10-CM | POA: Insufficient documentation

## 2020-04-24 DIAGNOSIS — J301 Allergic rhinitis due to pollen: Secondary | ICD-10-CM | POA: Insufficient documentation

## 2020-06-07 ENCOUNTER — Telehealth: Payer: Self-pay | Admitting: Oncology

## 2020-06-07 NOTE — Telephone Encounter (Signed)
Cancelled appts per 12/8 incoming call from pt requesting to cancel genetic counseling appt. Pt request to keep appt with [facility] as is.

## 2020-07-26 ENCOUNTER — Encounter (HOSPITAL_COMMUNITY): Payer: Self-pay

## 2020-08-13 NOTE — Progress Notes (Signed)
[facility]  [phone] Fax:(336) [phone]     ID: Felicia Beasley DOB: 1961/04/13  MR#: [id number]  LPF#:[id number]  Patient Care Team: Harlan Stains, MD as PCP - General (Family Medicine) Mauro Kaufmann, RN as Oncology Nurse Navigator Rockwell Germany, RN as Oncology Nurse Navigator Erroll Luna, MD as Consulting Physician (General Surgery) Paitynn Mikus, Virgie Dad, MD as Consulting Physician (Oncology) Newton Pigg, MD as Consulting Physician (Obstetrics and Gynecology) Gery Pray, MD as Consulting Physician (Radiation Oncology) Clarene Essex, Counselor (Genetic Counselor) Chauncey Cruel, MD OTHER MD:  CHIEF COMPLAINT: noninvasive breast cancer  CURRENT TREATMENT:  anastrozole   INTERVAL HISTORY: Felicia Beasley returns today for follow up of her noninvasive breast cancer.   Since her last visit, she completed radiation therapy on 11/22/2019 under Dr. Sondra Come.  She began anastrozole on 01/03/2020.  She is generally tolerating it well.  She has some stiffness in the mornings which may or may not be related to this but otherwise no significant side effects.  She underwent bilateral diagnostic mammography with tomography at [facility] on 04/18/2020 showing: breast density category B; no evidence of malignancy in either breast.    REVIEW OF SYSTEMS: Felicia Beasley is retiring and found herself in a new primary care physician.  We have entered that.  She goes to step classes and also walks at least 2 miles at least 2 or 3 times a week.  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 She underwent right diagnostic mammography with tomography and right breast ultrasonography at [facility] on 02/03/2019 showing: breast density category B; probably benign 0.9 cm mass at 10 o'clock. Repeat study was recommended in 6 months.   She returned on 08/09/2019 for repeat right diagnostic mammogram and right breast ultrasound showing: breast density category B; slightly more suspicious appearance of 0.9 cm mass at 10 o'clock, difficult to determine if this corresponds to mammographic asymmetry.  Accordingly on 08/18/2019 she proceeded to biopsy of the right breast area in question. The pathology from this procedure ([id number]) showed: ductal carcinoma in situ, intermediate grade, with necrosis. Prognostic indicators significant for: estrogen receptor, 100% positive with strong staining intensity and progesterone receptor, 100% positive with moderate staining intensity.  She is scheduled for right lumpectomy on 09/08/2019 under Dr. Brantley Stage.  The patient's subsequent history is as detailed below.   PAST MEDICAL HISTORY: Past Medical History:  Diagnosis Date   Breast cancer ([place])    Edema leg    Family history of ovarian cancer    History of recurrent UTIs    Migraine headache    Personal history of radiation therapy    Sinusitis    Venous insufficiency     PAST SURGICAL HISTORY: Past Surgical History:  Procedure Laterality Date   ABDOMINAL HYSTERECTOMY Bilateral 04/11/2009   w/ BSO; for menorrhagia and fibroids   BREAST BIOPSY     BREAST LUMPECTOMY Right 09/08/2019   BREAST LUMPECTOMY WITH RADIOACTIVE SEED LOCALIZATION Right 09/08/2019   Procedure: RIGHT BREAST LUMPECTOMY WITH RADIOACTIVE SEED LOCALIZATION;  Surgeon: Erroll Luna, MD;  Location: [facility];  Service: General;  Laterality: Right;   CESAREAN SECTION      FAMILY HISTORY: Family History  Problem Relation Age of  Onset   Arthritis Mother    Diabetes Mother    Arthritis Father    Ovarian cancer Half-Sister 69   Emphysema Maternal Grandfather        smoker  The  patient's father is 69 years old and her mother is 46 years old as of March 2021.  The patient has 2 brothers and 2 sisters, all half siblings.  One sister had ovarian cancer diagnosed at age 22.  There is no other history of cancer in the family to the patient's knowledge   GYNECOLOGIC HISTORY:  Patient's last menstrual period was 04/11/2009 (exact date). Menarche: 60 years old Age at first live birth: 60 years old [place] P 3 LMP 2010 Contraceptive more than 1 year, no complications HRT no  Hysterectomy? Yes, for menorrhagia and fibroids BSO? 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  She is planning to retire in 2022.  Her husband  manages a [employer] location and is Company secretary of the [employer] which the patient attends.  Daughter Janett Billow 65 in [place] works for the [employer] son Corene Cornea 24 in [place] works for [employer] and is very much involved in Geologist, engineering.  Daughter Anderson Malta 68 in [place] is a homemaker.  The patient has 2 grandchildren and a third 1 due August 2021.    ADVANCED DIRECTIVES: In the absence of documents to the contrary the patient's husband is her healthcare power of attorney   HEALTH MAINTENANCE: Social History   Tobacco Use   Smoking status: Never Smoker   Smokeless tobacco: Never Used  Substance Use Topics   Alcohol use: No   Drug use: No     Colonoscopy: 2010/[facility]  PAP: Per Dr. Ulanda Edison  Bone density: none on file   Allergies  Allergen Reactions   Ciprocinonide [Fluocinolone]     nausea   Macrobid [Nitrofurantoin Macrocrystal]     migraine    Current Outpatient Medications  Medication Sig Dispense Refill   anastrozole (ARIMIDEX) 1 MG tablet Take 1 tablet (1 mg total) by mouth daily. Start 01/03/2020 90 tablet 4   Ascorbic Acid (VITAMIN C) 100 MG tablet Take 100 mg by mouth daily.     aspirin EC 81 MG tablet Take 81 mg by mouth daily.     Cranberry  1000 MG CAPS Take by mouth.     ibuprofen (ADVIL) 800 MG tablet Take 1 tablet (800 mg total) by mouth every 8 (eight) hours as needed. 30 tablet 0   Omega-3 Fatty Acids (FISH OIL) 1000 MG CAPS Fish Oil     Turmeric 400 MG CAPS Take by mouth.     No current facility-administered medications for this visit.    OBJECTIVE: African-American woman who appears well  Vitals:   08/14/20 1308  BP: 130/80  Pulse: 86  Resp: 15  Temp: 97.7 F (36.5 C)  SpO2: 100%     Body mass index is 35.51 kg/m.   Wt Readings from Last 3 Encounters:  08/14/20 220 lb (99.8 kg)  11/09/19 216 lb (98 kg)  09/29/19 214 lb 6.4 oz (97.3 kg)      ECOG FS:1 - Symptomatic but completely ambulatory  Sclerae unicteric, EOMs intact Wearing a mask No cervical or supraclavicular adenopathy Lungs no rales or rhonchi Heart regular rate and rhythm Abd soft, nontender, positive bowel sounds MSK no focal spinal tenderness, no upper extremity lymphedema Neuro: nonfocal, well oriented, appropriate affect Breasts: The right breast is status  post lumpectomy and radiation.  The cosmetic result is excellent.  There is no evidence of local recurrence.  The left breast is benign.  Both axillae are benign.   LAB RESULTS:  CMP     Component Value Date/Time   NA 142 08/31/2019 1526   K 4.0 08/31/2019 1526   CL 106 08/31/2019 1526   CO2 28 08/31/2019 1526   GLUCOSE 92 08/31/2019 1526   BUN 13 08/31/2019 1526   CREATININE 0.87 08/31/2019 1526   CALCIUM 8.9 08/31/2019 1526   PROT 7.8 08/31/2019 1526   ALBUMIN 3.9 08/31/2019 1526   AST 14 (L) 08/31/2019 1526   ALT 13 08/31/2019 1526   ALKPHOS 71 08/31/2019 1526   BILITOT 0.2 (L) 08/31/2019 1526   GFRNONAA >60 08/31/2019 1526   GFRAA >60 08/31/2019 1526    No results found for: TOTALPROTELP, ALBUMINELP, A1GS, A2GS, BETS, BETA2SER, GAMS, MSPIKE, SPEI  Lab Results  Component Value Date   WBC 8.0 08/31/2019   NEUTROABS 4.9 08/31/2019   HGB 12.1 08/31/2019   HCT  38.4 08/31/2019   MCV 93.2 08/31/2019   PLT 257 08/31/2019    No results found for: LABCA2  No components found for: YYTKPT465  No results for input(s): INR in the last 168 hours.  No results found for: LABCA2  No results found for: KCL275  No results found for: TZG017  No results found for: CBS496  No results found for: CA2729  No components found for: HGQUANT  No results found for: CEA1 / No results found for: CEA1   No results found for: AFPTUMOR  No results found for: CHROMOGRNA  No results found for: KPAFRELGTCHN, LAMBDASER, KAPLAMBRATIO (kappa/lambda light chains)  No results found for: HGBA, HGBA2QUANT, HGBFQUANT, HGBSQUAN (Hemoglobinopathy evaluation)   No results found for: LDH  No results found for: IRON, TIBC, IRONPCTSAT (Iron and TIBC)  No results found for: FERRITIN  Urinalysis No results found for: COLORURINE, APPEARANCEUR, LABSPEC, PHURINE, GLUCOSEU, HGBUR, BILIRUBINUR, KETONESUR, PROTEINUR, UROBILINOGEN, NITRITE, LEUKOCYTESUR   STUDIES: No results found.   ELIGIBLE FOR AVAILABLE RESEARCH PROTOCOL: not a COMET candidate (mass on imaging)  ASSESSMENT: 60 y.o. [place] woman status post right breast upper outer quadrant biopsy 08/18/2019 showing ductal carcinoma in situ, grade 2, estrogen and progesterone receptor positive.  (1)  status post right lumpectomy 09/08/2019 for ductal carcinoma in situ, grade 2, measuring 1.5 cm, with negative margins.  (2) adjuvant radiation Dates: 10/07/2019 through 11/22/2019 Site Technique Total Dose (Gy) Dose per Fx (Gy) Completed Fx Beam Energies  Breast, Right: Breast_Rt_Bst 3D 10/10 2 5/5 6X, 10X  Breast, Right: Breast_Rt 3D 50.4/50.4 1.8 28/28 10X    (3) genetics testing discussed 09/09/2019, see genetic counselors note that date  (4) started anastrozole 01/03/2020   PLAN:  Felicia Beasley is now just about a year out from definitive surgery for her noninvasive breast cancer.  There is no  evidence of disease recurrence.  She is tolerating anastrozole well and the plan will be to continue that a total of 5 years.  She will see her surgeon sometime in June and her primary care physician sometime in October or November.  Accordingly she will see [place] in February next year.  I have set her up for survivorship visit at that time.  We will obtain a bone density with her next mammography at the breast center in October  She knows to call for any other issue that may develop before the next visit  Total encounter time 25  minutes.Chauncey Cruel, MD   08/14/2020 1:18 PM Medical Oncology and Hematology [facility] [place], [place] [address] Tel. [phone]    Fax. [phone]   This document serves as a record of services personally performed by Lurline Del, MD. It was created on his behalf by Wilburn Mylar, a trained medical scribe. The creation of this record is based on the scribe's personal observations and the provider's statements to them.   I, Lurline Del MD, have reviewed the above documentation for accuracy and completeness, and I agree with the above.   *Total Encounter Time as defined by the Centers for Medicare and Medicaid Services includes, in addition to the face-to-face time of a patient visit (documented in the note above) non-face-to-face time: obtaining and reviewing outside history, ordering and reviewing medications, tests or procedures, care coordination (communications with other health care professionals or caregivers) and documentation in the medical record.

## 2020-08-14 ENCOUNTER — Inpatient Hospital Stay: Payer: BC Managed Care – PPO | Attending: Oncology | Admitting: Oncology

## 2020-08-14 ENCOUNTER — Other Ambulatory Visit: Payer: BC Managed Care – PPO

## 2020-08-14 ENCOUNTER — Other Ambulatory Visit: Payer: Self-pay

## 2020-08-14 ENCOUNTER — Encounter: Payer: BC Managed Care – PPO | Admitting: Genetic Counselor

## 2020-08-14 VITALS — BP 130/80 | HR 86 | Temp 97.7°F | Resp 15 | Ht 66.0 in | Wt 220.0 lb

## 2020-08-14 DIAGNOSIS — Z923 Personal history of irradiation: Secondary | ICD-10-CM | POA: Diagnosis not present

## 2020-08-14 DIAGNOSIS — Z7982 Long term (current) use of aspirin: Secondary | ICD-10-CM | POA: Diagnosis not present

## 2020-08-14 DIAGNOSIS — Z170 Estrogen receptor positive status [ER+]: Secondary | ICD-10-CM | POA: Diagnosis not present

## 2020-08-14 DIAGNOSIS — Z79811 Long term (current) use of aromatase inhibitors: Secondary | ICD-10-CM | POA: Insufficient documentation

## 2020-08-14 DIAGNOSIS — C50411 Malignant neoplasm of upper-outer quadrant of right female breast: Secondary | ICD-10-CM | POA: Insufficient documentation

## 2020-08-14 DIAGNOSIS — Z79899 Other long term (current) drug therapy: Secondary | ICD-10-CM | POA: Insufficient documentation

## 2020-08-15 ENCOUNTER — Telehealth: Payer: Self-pay | Admitting: Oncology

## 2020-08-15 NOTE — Telephone Encounter (Signed)
Scheduled appts per 2/14 los. Pt asked for a call back on 2/16. Will call pt on 2/16.

## 2021-02-02 ENCOUNTER — Other Ambulatory Visit: Payer: Self-pay | Admitting: Oncology

## 2021-03-20 ENCOUNTER — Other Ambulatory Visit: Payer: Self-pay | Admitting: Family Medicine

## 2021-03-20 DIAGNOSIS — R928 Other abnormal and inconclusive findings on diagnostic imaging of breast: Secondary | ICD-10-CM

## 2021-04-19 ENCOUNTER — Other Ambulatory Visit: Payer: Self-pay | Admitting: Oncology

## 2021-04-19 DIAGNOSIS — Z853 Personal history of malignant neoplasm of breast: Secondary | ICD-10-CM

## 2021-04-23 ENCOUNTER — Other Ambulatory Visit: Payer: Self-pay

## 2021-04-23 ENCOUNTER — Ambulatory Visit
Admission: RE | Admit: 2021-04-23 | Discharge: 2021-04-23 | Disposition: A | Discharge status: Home / Self Care | Payer: BC Managed Care – PPO | Source: Ambulatory Visit | Attending: Family Medicine | Admitting: Family Medicine

## 2021-04-23 DIAGNOSIS — Z853 Personal history of malignant neoplasm of breast: Secondary | ICD-10-CM

## 2021-08-15 ENCOUNTER — Other Ambulatory Visit: Payer: Self-pay

## 2021-08-15 ENCOUNTER — Inpatient Hospital Stay: Payer: BC Managed Care – PPO | Attending: Adult Health | Admitting: Adult Health

## 2021-08-15 ENCOUNTER — Encounter: Payer: Self-pay | Admitting: Adult Health

## 2021-08-15 VITALS — BP 118/66 | HR 77 | Temp 97.7°F | Resp 18 | Ht 66.0 in | Wt 210.2 lb

## 2021-08-15 DIAGNOSIS — E2839 Other primary ovarian failure: Secondary | ICD-10-CM | POA: Diagnosis not present

## 2021-08-15 DIAGNOSIS — C50411 Malignant neoplasm of upper-outer quadrant of right female breast: Secondary | ICD-10-CM | POA: Diagnosis not present

## 2021-08-15 DIAGNOSIS — Z79811 Long term (current) use of aromatase inhibitors: Secondary | ICD-10-CM | POA: Diagnosis not present

## 2021-08-15 DIAGNOSIS — Z170 Estrogen receptor positive status [ER+]: Secondary | ICD-10-CM | POA: Diagnosis not present

## 2021-08-15 DIAGNOSIS — D0511 Intraductal carcinoma in situ of right breast: Secondary | ICD-10-CM | POA: Diagnosis not present

## 2021-08-15 NOTE — Progress Notes (Signed)
CLINIC:  Survivorship   REASON FOR VISIT:  Routine follow-up for history of breast cancer.   BRIEF ONCOLOGIC HISTORY:  [name] status post right breast upper outer quadrant biopsy 08/18/2019 showing ductal carcinoma in situ, grade 2, estrogen and progesterone receptor positive.   (1)  status post right lumpectomy 09/08/2019 for ductal carcinoma in situ, grade 2, measuring 1.5 cm, with negative margins.   (2) adjuvant radiation Dates: 10/07/2019 through 11/22/2019 Site Technique Total Dose (Gy) Dose per Fx (Gy) Completed Fx Beam Energies  Breast, Right: Breast_Rt_Bst 3D 10/10 2 5/5 6X, 10X  Breast, Right: Breast_Rt 3D 50.4/50.4 1.8 28/28 10X      (3) genetics testing discussed 09/09/2019--declined at that time, and continues to consider it, will let us know if she decides to proceed   (4) started anastrozole 01/03/2020    INTERVAL HISTORY:  [name] presents to the [facility] today for routine follow-up for her history of breast cancer.  Overall, she reports feeling quite well.   Felicia is taking Anastrozole daily and tolerates it well.  She has minimal hot flashes, vaginal dryness, or arthralgias.   Her most recent mammogram was completed on April 23, 2021 and showed no evidence of malignancy and breast density category B.  REVIEW OF SYSTEMS:  Review of Systems  Constitutional:  Negative for appetite change, chills, fatigue, fever and unexpected weight change.  HENT:   Negative for hearing loss, lump/mass and trouble swallowing.   Eyes:  Negative for eye problems and icterus.  Respiratory:  Negative for chest tightness, cough and shortness of breath.   Cardiovascular:  Negative for chest pain, leg swelling and palpitations.  Gastrointestinal:  Negative for abdominal distention, abdominal pain, constipation, diarrhea, nausea and vomiting.  Endocrine: Negative for hot flashes.  Genitourinary:  Negative for difficulty urinating.    Musculoskeletal:  Negative for arthralgias.  Skin:  Negative for itching and rash.  Neurological:  Negative for dizziness, extremity weakness, headaches and numbness.  Hematological:  Negative for adenopathy. Does not bruise/bleed easily.  Psychiatric/Behavioral:  Negative for depression. The patient is not nervous/anxious.   Breast: Denies any new nodularity, masses, tenderness, nipple changes, or nipple discharge.       PAST MEDICAL/SURGICAL HISTORY:  Past Medical History:  Diagnosis Date   Breast cancer ([place])    Edema leg    Family history of ovarian cancer    History of recurrent UTIs    Migraine headache    Personal history of radiation therapy    Sinusitis    Venous insufficiency    Past Surgical History:  Procedure Laterality Date   ABDOMINAL HYSTERECTOMY Bilateral 04/11/2009   w/ BSO; for menorrhagia and fibroids   BREAST BIOPSY     BREAST LUMPECTOMY Right 09/08/2019   BREAST LUMPECTOMY WITH RADIOACTIVE SEED LOCALIZATION Right 09/08/2019   Procedure: RIGHT BREAST LUMPECTOMY WITH RADIOACTIVE SEED LOCALIZATION;  Surgeon: Erroll Luna, MD;  Location: [facility];  Service: General;  Laterality: Right;   CESAREAN SECTION       ALLERGIES:  Allergies  Allergen Reactions   Ciprocinonide [Fluocinolone]     nausea   Macrobid [Nitrofurantoin Macrocrystal]     migraine     CURRENT MEDICATIONS:  Outpatient Encounter Medications as of 08/15/2021  Medication Sig   anastrozole (ARIMIDEX) 1 MG tablet TAKE 1 TABLET (1 MG TOTAL) BY MOUTH DAILY. START 01/03/2020   Ascorbic Acid (VITAMIN C) 100 MG tablet Take 100 mg by mouth daily.   aspirin EC 81  MG tablet Take 81 mg by mouth daily.   Cranberry 1000 MG CAPS Take by mouth.   ibuprofen (ADVIL) 800 MG tablet Take 1 tablet (800 mg total) by mouth every 8 (eight) hours as needed.   Omega-3 Fatty Acids (FISH OIL) 1000 MG CAPS Fish Oil   Turmeric 400 MG CAPS Take by mouth.   No facility-administered encounter  medications on file as of 08/15/2021.     ONCOLOGIC FAMILY HISTORY:  Family History  Problem Relation Age of Onset   Arthritis Mother    Diabetes Mother    Arthritis Father    Ovarian cancer Half-Sister 48   Emphysema Maternal Grandfather        smoker    GENETIC COUNSELING/TESTING: Not at this time   SOCIAL HISTORY:  Social History   Socioeconomic History   Marital status: Married    Spouse name: Not on file   Number of children: Not on file   Years of education: Not on file   Highest education level: Not on file  Occupational History   Not on file  Tobacco Use   Smoking status: Never   Smokeless tobacco: Never  Substance and Sexual Activity   Alcohol use: No   Drug use: No   Sexual activity: Not on file  Other Topics Concern   Not on file  Social History Narrative   Not on file   Social Determinants of Health   Financial Resource Strain: Not on file  Food Insecurity: Not on file  Transportation Needs: Not on file  Physical Activity: Not on file  Stress: Not on file  Social Connections: Not on file  Intimate Partner Violence: Not on file   .     PHYSICAL EXAMINATION:  Vital Signs: Vitals:   08/15/21 1104  BP: 118/66  Pulse: 77  Resp: 18  Temp: 97.7 F (36.5 C)  SpO2: 100%   Filed Weights   08/15/21 1104  Weight: 210 lb 3.2 oz (95.3 kg)   GENERAL: Patient is a well appearing female in no acute distress HEENT:  Sclerae anicteric.  Oropharynx clear and moist. No ulcerations or evidence of oropharyngeal candidiasis. Neck is supple.  NODES:  No cervical, supraclavicular, or axillary lymphadenopathy palpated.  BREAST EXAM: Right breast is status post lumpectomy and radiation no sign of local recurrence left breast is benign. LUNGS:  Clear to auscultation bilaterally.  No wheezes or rhonchi. HEART:  Regular rate and rhythm. No murmur appreciated. ABDOMEN:  Soft, nontender.  Positive, normoactive bowel sounds. No organomegaly palpated. MSK:  No  focal spinal tenderness to palpation. Full range of motion bilaterally in the upper extremities. EXTREMITIES:  No peripheral edema.   SKIN:  Clear with no obvious rashes or skin changes. No nail dyscrasia. NEURO:  Nonfocal. Well oriented.  Appropriate affect.   LABORATORY DATA:  None for this visit     ASSESSMENT AND PLAN:  Felicia Beasley is a pleasant 61 y.o. female with history of Stage 0 right breast noninvasive breast cancer estrogen and progesterone positive, diagnosed in February 2021 status postlumpectomy, adjuvant radiation, and anastrozole beginning July 2021.   She [facility] for surveillance and routine follow-up.   1. History of breast cancer:  Felicia Beasley is currently clinically and radiographically without evidence of disease or recurrence of breast cancer. She will be due for mammogram in October 2023; orders placed today.  She will continue her anti-estrogen therapy with anastrozole, with plans to continue for 5 years.  She notes  that she has completed her follow-up with surgery.  Therefore we will see her back in 6 months for continued long-term survivorship surveillance.  I encouraged her to call me with any questions or concerns before her next visit at the cancer center, and I would be happy to see her sooner, if needed.    2. Bone health:  Given Felicia Beasley's age, history of breast cancer, and her current anti-estrogen therapy with anastrozole, she is at risk for bone demineralization.  She has not undergone bone density testing.  I have ordered this to be completed when she undergoes her mammogram in October 2023.  She was given education on specific food and activities to promote bone health.  3. Cancer screening:  Due to Felicia Beasley's history and her age, she should receive screening for skin cancers, colon cancer, and gynecologic cancers. She was encouraged to follow-up with her PCP for appropriate cancer screenings.   4. Health maintenance and  wellness promotion: Felicia Beasley was encouraged to consume 5-7 servings of fruits and vegetables per day. She was also encouraged to engage in moderate to vigorous exercise for 30 minutes per day most days of the week. She was instructed to limit her alcohol consumption and continue to abstain from tobacco use.    Dispo:  -Return to cancer center in 6 months for continued follow-up and surveillance -Mammogram in October 2023 -Bone density in October 2023  Total encounter time: 20 minutes in face-to-face visit time, chart review, lab review, care coordination, order entry, and documentation of the encounter.  Wilber Bihari, NP 08/15/21 11:08 AM Medical Oncology and Hematology [facility] [place], [place] [address] Tel. [phone]    Fax. [phone]  *Total Encounter Time as defined by the Centers for Medicare and Medicaid Services includes, in addition to the face-to-face time of a patient visit (documented in the note above) non-face-to-face time: obtaining and reviewing outside history, ordering and reviewing medications, tests or procedures, care coordination (communications with other health care professionals or caregivers) and documentation in the medical record.    Note: PRIMARY CARE PROVIDER [name], [place] [phone]

## 2021-08-16 ENCOUNTER — Telehealth: Payer: Self-pay | Admitting: Adult Health

## 2021-08-16 NOTE — Telephone Encounter (Signed)
Scheduled appointment per 2/15 los. Patient is aware. Patient will be mailed an updated calendar.

## 2021-09-28 IMAGING — MG MM DIGITAL DIAGNOSTIC UNILAT*R* W/ TOMO W/ CAD
6 series · 7 of 18 positions shown · non-contrast
Comparison: Previous exam(s).
COMPARISON: Previous exam(s).

Addendum:
CLINICAL DATA: Follow-up probably benign mass in the 10 o'clock
position of the right breast.

EXAM:
DIGITAL DIAGNOSTIC RIGHT MAMMOGRAM WITH CAD AND TOMO
ULTRASOUND RIGHT BREAST

[R CC synth-2D (1 of 2)]
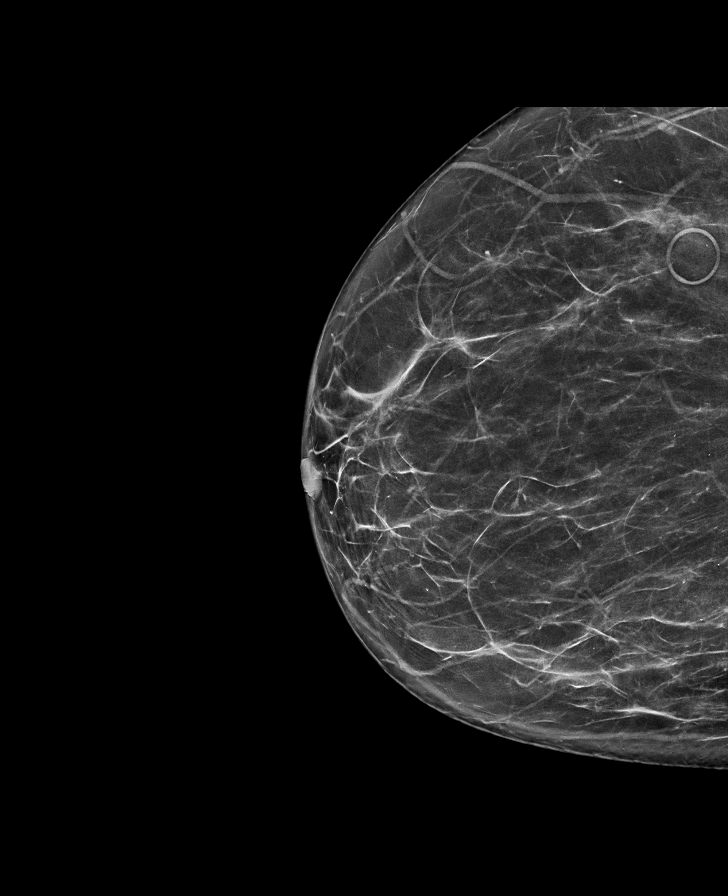

[R MLO synth-2D]
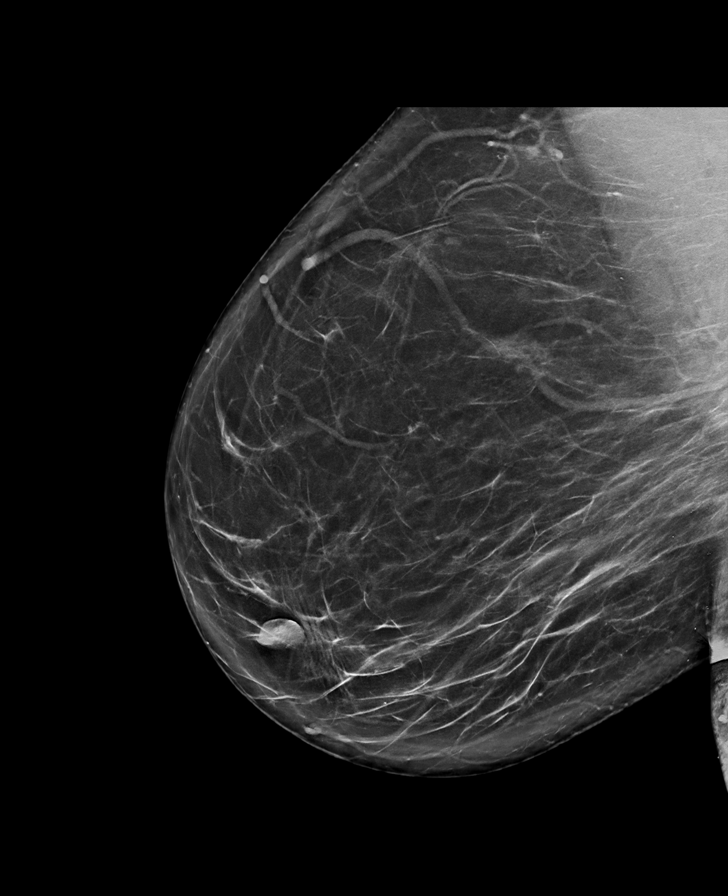

[R CC synth-2D (2 of 2)]
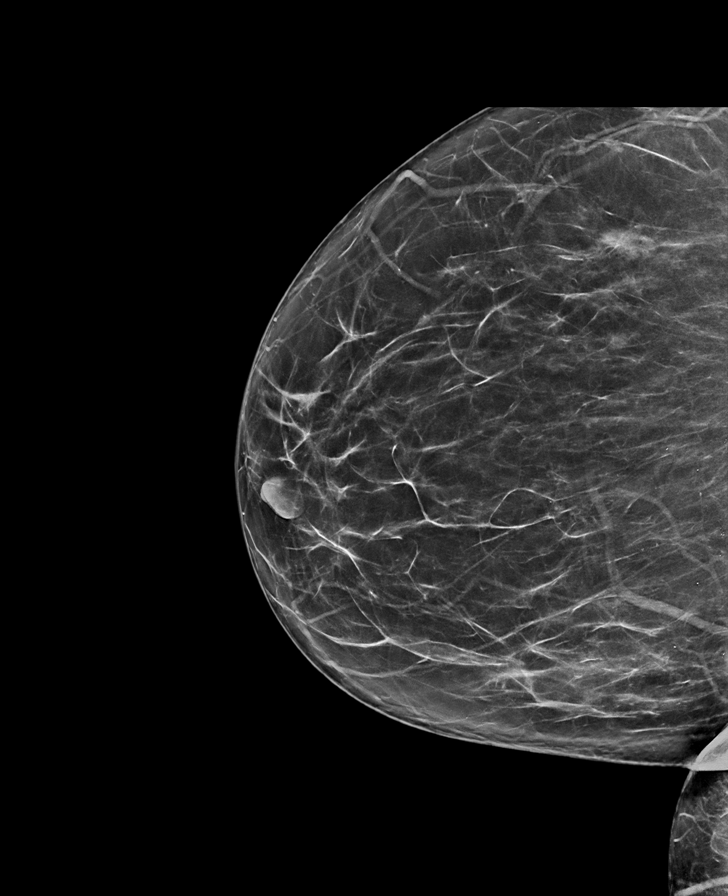

[R CC tomo · 2 of 80 frames shown (1 of 2)]
[frame 26/80]
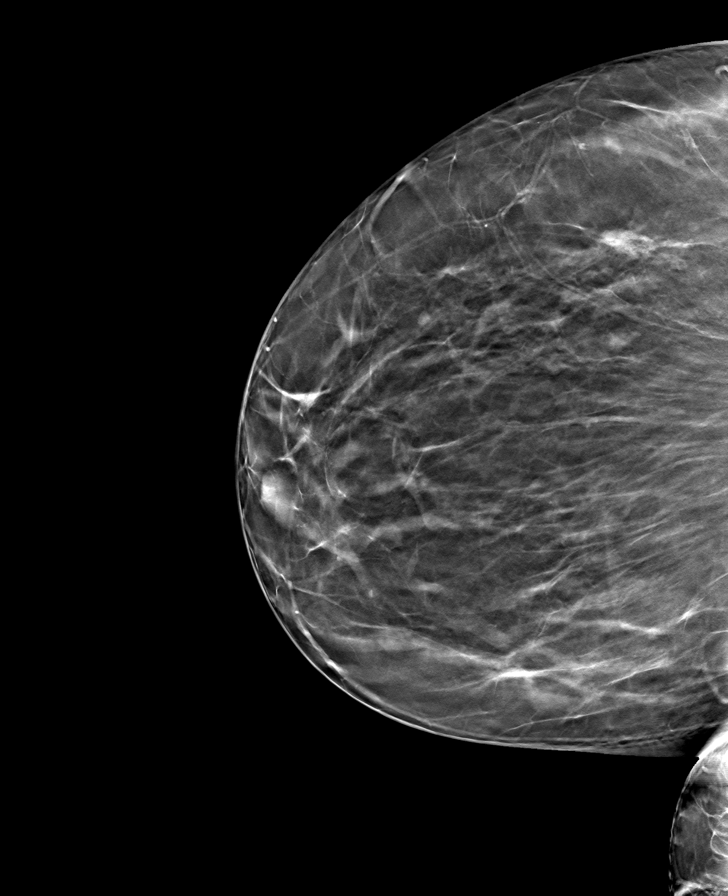
[frame 41/80]
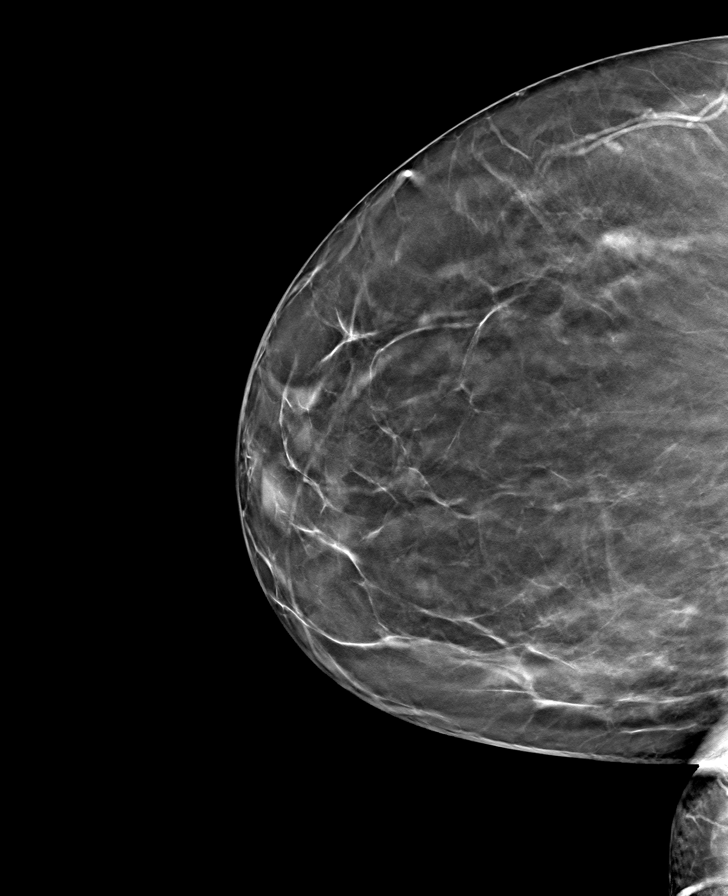

[R MLO tomo · tomo slice 46/91.0]
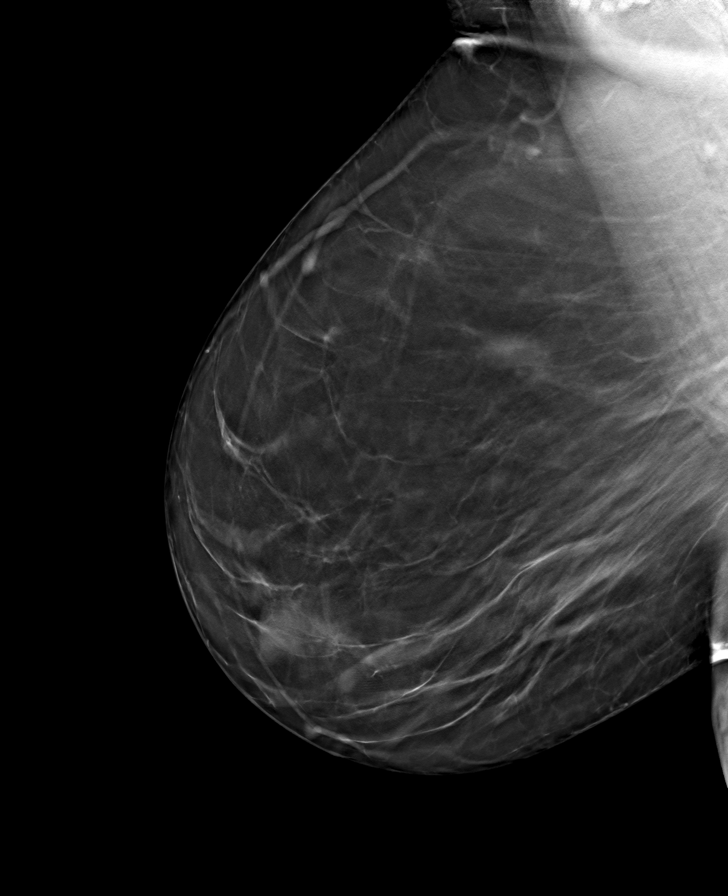

[R CC tomo (2 of 2) · tomo slice 37/73.0]
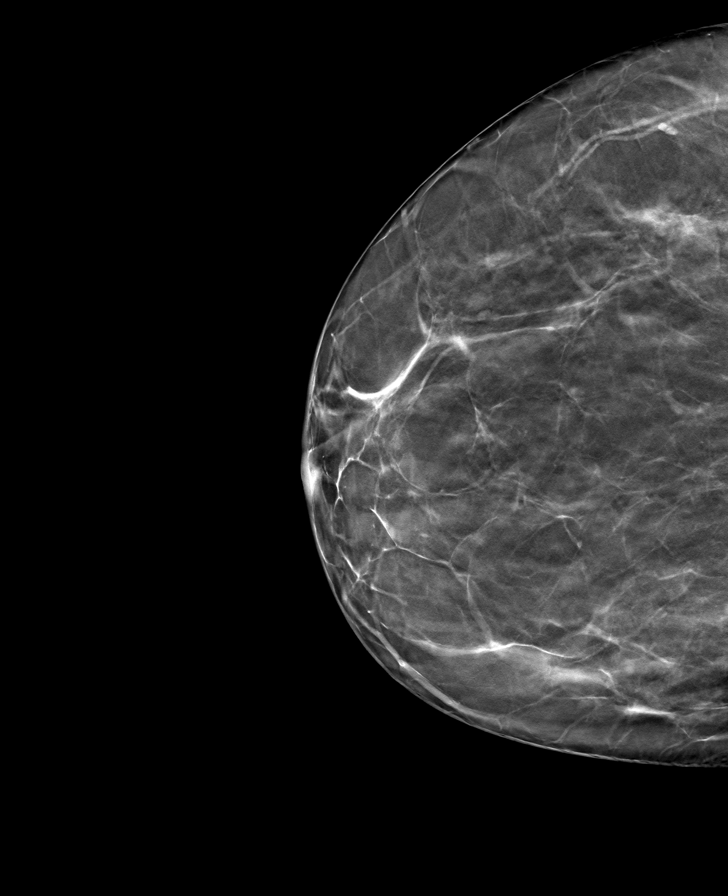

[7 of 18 positions shown; findings below may reference images not displayed]

ACR Breast Density Category b: There are scattered areas of
fibroglandular density.
FINDINGS: An oval asymmetry with ill-defined margins and some interspersed fat
is again demonstrated in the posterior aspect of the outer right
breast. This appears slightly more irregular today without
significant change in size. There is also a suggestion of mild
interval distortion associated with the asymmetry. No findings
elsewhere in the right breast suspicious for malignancy.

Mammographic images were processed with CAD.

On physical exam, no mass is palpable in the upper outer right
breast.

Targeted ultrasound is performed, showing a 9 x 9 x 4 mm irregular,
mildly hypoechoic area with interspersed increased echogenicity in
the 10 o'clock position of the right breast, 10 cm from the nipple,
within an area of dense glandular tissue. This measured 9 x 8 x 4 mm
02/03/2019. The mildly hypoechoic areas have an echogenicity similar
to adjacent fat lobules.
IMPRESSION: Slightly more suspicious appearance of the mammographic asymmetry in
the upper outer right breast. It is difficult to determine if the
ultrasound finding in that area of the breast corresponds to the
mammographic asymmetry.

RECOMMENDATION:
3D stereotactic guided core needle biopsy of the mammographic
asymmetry in the outer right breast. This has been discussed with
the patient and scheduled at [DATE] a.m. on 08/18/2019.

I have discussed the findings and recommendations with the patient.
If applicable, a reminder letter will be sent to the patient
regarding the next appointment.

BI-RADS CATEGORY  4: Suspicious.

ADDENDUM:
Ultrasound of the right axilla was performed on 08/09/2019 and
demonstrated no abnormal lymph nodes.

*** End of Addendum ***
ACR Breast Density Category b: There are scattered areas of
fibroglandular density.
FINDINGS: An oval asymmetry with ill-defined margins and some interspersed fat
is again demonstrated in the posterior aspect of the outer right
breast. This appears slightly more irregular today without
significant change in size. There is also a suggestion of mild
interval distortion associated with the asymmetry. No findings
elsewhere in the right breast suspicious for malignancy.

Mammographic images were processed with CAD.

On physical exam, no mass is palpable in the upper outer right
breast.

Targeted ultrasound is performed, showing a 9 x 9 x 4 mm irregular,
mildly hypoechoic area with interspersed increased echogenicity in
the 10 o'clock position of the right breast, 10 cm from the nipple,
within an area of dense glandular tissue. This measured 9 x 8 x 4 mm
02/03/2019. The mildly hypoechoic areas have an echogenicity similar
to adjacent fat lobules.
IMPRESSION: Slightly more suspicious appearance of the mammographic asymmetry in
the upper outer right breast. It is difficult to determine if the
ultrasound finding in that area of the breast corresponds to the
mammographic asymmetry.

RECOMMENDATION:
3D stereotactic guided core needle biopsy of the mammographic
asymmetry in the outer right breast. This has been discussed with
the patient and scheduled at [DATE] a.m. on 08/18/2019.

I have discussed the findings and recommendations with the patient.
If applicable, a reminder letter will be sent to the patient
regarding the next appointment.

BI-RADS CATEGORY  4: Suspicious.

## 2022-02-12 ENCOUNTER — Inpatient Hospital Stay: Payer: BC Managed Care – PPO | Attending: Adult Health | Admitting: Adult Health

## 2022-02-12 ENCOUNTER — Other Ambulatory Visit: Payer: Self-pay

## 2022-02-12 ENCOUNTER — Encounter: Payer: Self-pay | Admitting: Adult Health

## 2022-02-12 VITALS — BP 139/82 | HR 85 | Temp 97.5°F | Resp 18 | Ht 66.0 in | Wt 215.6 lb

## 2022-02-12 DIAGNOSIS — Z79811 Long term (current) use of aromatase inhibitors: Secondary | ICD-10-CM | POA: Insufficient documentation

## 2022-02-12 DIAGNOSIS — Z170 Estrogen receptor positive status [ER+]: Secondary | ICD-10-CM | POA: Insufficient documentation

## 2022-02-12 DIAGNOSIS — C50411 Malignant neoplasm of upper-outer quadrant of right female breast: Secondary | ICD-10-CM | POA: Diagnosis present
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
## 2022-02-13 ENCOUNTER — Telehealth: Payer: Self-pay | Admitting: Adult Health

## 2022-02-13 NOTE — Telephone Encounter (Signed)
Scheduled appointment per 8/15 los. Patient is aware.

## 2022-02-26 ENCOUNTER — Other Ambulatory Visit: Payer: Self-pay

## 2022-02-26 MED ORDER — ANASTROZOLE 1 MG PO TABS: 1.0000 mg | ORAL_TABLET | Freq: Every day | ORAL | 3 refills | Status: DC

## 2022-04-24 ENCOUNTER — Ambulatory Visit
Admission: RE | Admit: 2022-04-24 | Discharge: 2022-04-24 | Disposition: A | Discharge status: Home / Self Care | Payer: BC Managed Care – PPO | Source: Ambulatory Visit | Attending: Adult Health | Admitting: Adult Health

## 2022-04-24 DIAGNOSIS — E2839 Other primary ovarian failure: Secondary | ICD-10-CM

## 2022-04-24 DIAGNOSIS — Z170 Estrogen receptor positive status [ER+]: Secondary | ICD-10-CM

## 2022-04-25 ENCOUNTER — Encounter: Payer: Self-pay | Admitting: Adult Health

## 2022-04-29 ENCOUNTER — Telehealth: Payer: Self-pay | Admitting: *Deleted

## 2022-04-29 NOTE — Telephone Encounter (Signed)
-----   Message from Gardenia Phlegm, NP sent at 04/26/2022  8:21 AM EDT ----- Regarding: FW: Please let patient know she has osteopenia, recommend calcium, vitamin d and weight bearing exercises, repeat bone density in two years ----- Message ----- From: Interface, Rad Results In Sent: 04/24/2022   1:57 PM EDT To: Gardenia Phlegm, NP

## 2022-04-29 NOTE — Telephone Encounter (Signed)
Notified of message below

## 2023-02-13 ENCOUNTER — Encounter: Payer: Self-pay | Admitting: Adult Health

## 2023-02-13 ENCOUNTER — Other Ambulatory Visit: Payer: Self-pay

## 2023-02-13 ENCOUNTER — Inpatient Hospital Stay: Payer: BC Managed Care – PPO | Attending: Adult Health | Admitting: Adult Health

## 2023-02-13 VITALS — BP 130/78 | HR 82 | Temp 97.9°F | Resp 18 | Ht 66.0 in | Wt 219.7 lb

## 2023-02-13 DIAGNOSIS — C50411 Malignant neoplasm of upper-outer quadrant of right female breast: Secondary | ICD-10-CM | POA: Diagnosis present

## 2023-02-13 DIAGNOSIS — Z170 Estrogen receptor positive status [ER+]: Secondary | ICD-10-CM | POA: Diagnosis not present

## 2023-02-13 DIAGNOSIS — Z79811 Long term (current) use of aromatase inhibitors: Secondary | ICD-10-CM | POA: Diagnosis not present

## 2023-02-13 DIAGNOSIS — Z923 Personal history of irradiation: Secondary | ICD-10-CM | POA: Diagnosis not present

## 2023-02-13 DIAGNOSIS — R5383 Other fatigue: Secondary | ICD-10-CM | POA: Diagnosis not present

## 2023-02-13 NOTE — Progress Notes (Signed)
[facility] Cancer Follow up:    Felicia Gip, NP [address] [place] [address]   DIAGNOSIS:  Cancer Staging  Malignant neoplasm of upper-outer quadrant of right breast in female, estrogen receptor positive (HCC) Staging form: Breast, AJCC 8th Edition - Clinical stage from 08/18/2019: Stage 0 (cTis (DCIS), cN0, cM0, ER+, PR+) - Signed by Loa Socks, NP on 08/25/2019 Stage prefix: Initial diagnosis - Pathologic stage from 09/08/2019: Stage 0 (pTis (DCIS), pN0, cM0, ER+, PR+) - Signed by Loa Socks, NP on 09/22/2019 Stage prefix: Initial diagnosis   SUMMARY OF ONCOLOGIC HISTORY: Oncology History  Malignant neoplasm of upper-outer quadrant of right breast in female, estrogen receptor positive (HCC)  08/18/2019 Initial Diagnosis   [facility] woman status post right breast upper outer quadrant biopsy 08/18/2019 showing ductal carcinoma in situ, grade 2, estrogen and progesterone receptor positive.   08/18/2019 Cancer Staging   Staging form: Breast, AJCC 8th Edition - Clinical stage from 08/18/2019: Stage 0 (cTis (DCIS), cN0, cM0, ER+, PR+) - Signed by Loa Socks, NP on 08/25/2019   09/08/2019 Surgery   status post right lumpectomy 09/08/2019 for ductal carcinoma in situ, grade 2, measuring 1.5 cm, with negative margins.   09/08/2019 Cancer Staging   Staging form: Breast, AJCC 8th Edition - Pathologic stage from 09/08/2019: Stage 0 (pTis (DCIS), pN0, cM0, ER+, PR+) - Signed by Loa Socks, NP on 09/22/2019   09/09/2019 Genetic Testing   genetics testing discussed 09/09/2019--declined at that time, and continues to consider it, will let us know if she decides to proceed     10/07/2019 - 11/22/2019 Radiation Therapy    10/07/2019 through 11/22/2019 Site Technique Total Dose (Gy) Dose per Fx (Gy) Completed Fx Beam Energies  Breast, Right: Breast_Rt_Bst 3D 10/10 2 5/5 6X, 10X  Breast, Right: Breast_Rt 3D  50.4/50.4 1.8 28/28 10X     01/03/2020 -  Anti-estrogen oral therapy   started anastrozole 01/03/2020       CURRENT THERAPY: Anastrozole  INTERVAL HISTORY: Felicia Beasley 62 y.o. female returns for follow-up of her breast cancer.  She continues on anastrozole daily.  He is experiencing increased fatigue being on the anastrozole daily and wants to take a holiday from it to see how it might improve.  Her most recent mammogram occurred on April 24, 2022 demonstrating no mammographic evidence of malignancy and breast density category B.  He also underwent bone density testing on the same day that demonstrated osteopenia with a T-score -2.1 in her right femur.   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  MEDICAL HISTORY: Past Medical History:  Diagnosis Date   Breast cancer (HCC)    Edema leg    Family history of ovarian cancer    History of recurrent UTIs    Migraine headache    Personal history of radiation therapy    Sinusitis    Venous insufficiency     SURGICAL HISTORY: Past Surgical History:  Procedure Laterality Date   ABDOMINAL HYSTERECTOMY Bilateral 04/11/2009   w/ BSO; for menorrhagia and fibroids   BREAST BIOPSY  BREAST LUMPECTOMY Right 09/08/2019   BREAST LUMPECTOMY WITH RADIOACTIVE SEED LOCALIZATION Right 09/08/2019   Procedure: RIGHT BREAST LUMPECTOMY WITH RADIOACTIVE SEED LOCALIZATION;  Surgeon: Harriette Bouillon, MD;  Location: [facility];  Service: General;  Laterality: Right;   CESAREAN SECTION      SOCIAL HISTORY: Social History    Socioeconomic History   Marital status: Married    Spouse name: Not on file   Number of children: Not on file   Years of education: Not on file   Highest education level: Not on file  Occupational History   Not on file  Tobacco Use   Smoking status: Never   Smokeless tobacco: Never  Substance and Sexual Activity   Alcohol use: No   Drug use: No   Sexual activity: Not on file  Other Topics Concern   Not on file  Social History Narrative   Not on file   Social Determinants of Health   Financial Resource Strain: Low Risk  (09/10/2022)   Received from [facility]   Overall Financial Resource Strain (CARDIA)    Difficulty of Paying Living Expenses: Not very hard  Food Insecurity: No Food Insecurity (09/10/2022)   Received from [facility]   Hunger Vital Sign    Worried About Running Out of Food in the Last Year: Never true    Ran Out of Food in the Last Year: Never true  Transportation Needs: No Transportation Needs (09/10/2022)   Received from [facility] - Transportation    Lack of Transportation (Medical): No    Lack of Transportation (Non-Medical): No  Physical Activity: Sufficiently Active (09/10/2022)   Received from [facility]   Exercise Vital Sign    Days of Exercise per Week: 3 days    Minutes of Exercise per Session: 60 min  Stress: No Stress Concern Present (09/10/2022)   Received from [facility] - Occupational Stress Questionnaire    Feeling of Stress : Not at all  Social Connections: Socially Integrated (09/10/2022)   Received from [facility]   Social Network    How would you rate your social network (family, work, friends)?: Good participation with social networks  Intimate Partner Violence: Not At Risk (09/10/2022)   Received from [facility]   HITS    Over the last 12 months how often did your partner physically hurt you?: 1    Over the last 12 months how often did your partner insult  you or talk down to you?: 1    Over the last 12 months how often did your partner threaten you with physical harm?: 1    Over the last 12 months how often did your partner scream or curse at you?: 1    FAMILY HISTORY: Family History  Problem Relation Age of Onset   Arthritis Mother    Diabetes Mother    Arthritis Father    Ovarian cancer Half-Sister 36   Emphysema Maternal Grandfather        smoker    Review of Systems  Constitutional:  Negative for appetite change, chills, fatigue, fever and unexpected weight change.  HENT:   Negative for hearing loss, lump/mass and trouble swallowing.   Eyes:  Negative for eye problems and icterus.  Respiratory:  Negative for chest tightness, cough and shortness of breath.   Cardiovascular:  Negative for chest pain, leg swelling and palpitations.  Gastrointestinal:  Negative for abdominal distention, abdominal pain, constipation, diarrhea, nausea  and vomiting.  Endocrine: Negative for hot flashes.  Genitourinary:  Negative for difficulty urinating.   Musculoskeletal:  Negative for arthralgias.  Skin:  Negative for itching and rash.  Neurological:  Negative for dizziness, extremity weakness, headaches and numbness.  Hematological:  Negative for adenopathy. Does not bruise/bleed easily.  Psychiatric/Behavioral:  Negative for depression. The patient is not nervous/anxious.       PHYSICAL EXAMINATION   Onc Performance Status - 02/13/23 0838       ECOG Perf Status   ECOG Perf Status Fully active, able to carry on all pre-disease performance without restriction      KPS SCALE   KPS % SCORE Normal, no compliants, no evidence of disease             Vitals:   02/13/23 0827  BP: 130/78  Pulse: 82  Resp: 18  Temp: 97.9 F (36.6 C)  SpO2: 100%    Physical Exam Constitutional:      General: She is not in acute distress.    Appearance: Normal appearance. She is not toxic-appearing.  HENT:     Head: Normocephalic and atraumatic.      Mouth/Throat:     Mouth: Mucous membranes are moist.     Pharynx: Oropharynx is clear. No oropharyngeal exudate or posterior oropharyngeal erythema.  Eyes:     General: No scleral icterus. Cardiovascular:     Rate and Rhythm: Normal rate and regular rhythm.     Pulses: Normal pulses.     Heart sounds: Normal heart sounds.  Pulmonary:     Effort: Pulmonary effort is normal.     Breath sounds: Normal breath sounds.  Abdominal:     General: Abdomen is flat. Bowel sounds are normal. There is no distension.     Palpations: Abdomen is soft.     Tenderness: There is no abdominal tenderness.  Musculoskeletal:        General: No swelling.     Cervical back: Neck supple.  Lymphadenopathy:     Cervical: No cervical adenopathy.  Skin:    General: Skin is warm and dry.     Findings: No rash.  Neurological:     General: No focal deficit present.     Mental Status: She is alert.  Psychiatric:        Mood and Affect: Mood normal.        Behavior: Behavior normal.     LABORATORY DATA:  CBC    Component Value Date/Time   WBC 8.0 08/31/2019 1526   WBC 9.7 04/12/2009 0525   RBC 4.12 08/31/2019 1526   HGB 12.1 08/31/2019 1526   HCT 38.4 08/31/2019 1526   PLT 257 08/31/2019 1526   MCV 93.2 08/31/2019 1526   MCH 29.4 08/31/2019 1526   MCHC 31.5 08/31/2019 1526   RDW 11.9 08/31/2019 1526   LYMPHSABS 2.4 08/31/2019 1526   MONOABS 0.5 08/31/2019 1526   EOSABS 0.1 08/31/2019 1526   BASOSABS 0.0 08/31/2019 1526    CMP     Component Value Date/Time   NA 142 08/31/2019 1526   K 4.0 08/31/2019 1526   CL 106 08/31/2019 1526   CO2 28 08/31/2019 1526   GLUCOSE 92 08/31/2019 1526   BUN 13 08/31/2019 1526   CREATININE 0.87 08/31/2019 1526   CALCIUM 8.9 08/31/2019 1526   PROT 7.8 08/31/2019 1526   ALBUMIN 3.9 08/31/2019 1526   AST 14 (L) 08/31/2019 1526   ALT 13 08/31/2019 1526   ALKPHOS 71 08/31/2019 1526  BILITOT 0.2 (L) 08/31/2019 1526   GFRNONAA >60 08/31/2019 1526   GFRAA  >60 08/31/2019 1526       ASSESSMENT and THERAPY PLAN:   Malignant neoplasm of upper-outer quadrant of right breast in female, estrogen receptor positive (HCC) Felicia Beasley is a 62 year old woman with history of estrogen positive noninvasive breast cancer diagnosed in February 2021 status postlumpectomy, adjuvant radiation, and began anastrozole treatment in July 2021.  Felicia Beasley has no clinical or radiographic sign of breast cancer recurrence.  She will continue to undergo annual mammogram and this is due again in October 2024.    She is continuing on anastrozole however due to the fatigue wants to take a holiday.  I think this is a smart idea and recommended that she take 4 weeks off from therapy.  We reviewed recommendations for healthy diet and exercise.  She will reach out to [place] at the end of the 4-week period to discuss restarting the anastrozole.  Bone density: She has osteopenia.  We reviewed calcium vitamin D weightbearing exercises and she we will repeat bone density testing in October 2025.  Felicia Beasley will return in 1 year for continued follow-up of her history of breast cancer.  We will also touch base with her in a few weeks as noted above.    All questions were answered. The patient knows to call the clinic with any problems, questions or concerns. We can certainly see the patient much sooner if necessary.  Total encounter time:20 minutes*in face-to-face visit time, chart review, lab review, care coordination, order entry, and documentation of the encounter time.    Lillard Anes, NP 02/13/23 9:04 AM Medical Oncology and Hematology [facility] [address] [place], [place] [address] Tel. [phone]    Fax. [phone]  *Total Encounter Time as defined by the Centers for Medicare and Medicaid Services includes, in addition to the face-to-face time of a patient visit (documented in the note above) non-face-to-face time: obtaining and reviewing outside history,  ordering and reviewing medications, tests or procedures, care coordination (communications with other health care professionals or caregivers) and documentation in the medical record.

## 2023-02-13 NOTE — Assessment & Plan Note (Signed)
Felicia Beasley is a 62 year old woman with history of estrogen positive noninvasive breast cancer diagnosed in February 2021 status postlumpectomy, adjuvant radiation, and began anastrozole treatment in July 2021.  Felicia Beasley has no clinical or radiographic sign of breast cancer recurrence.  She will continue to undergo annual mammogram and this is due again in October 2024.    She is continuing on anastrozole however due to the fatigue wants to take a holiday.  I think this is a smart idea and recommended that she take 4 weeks off from therapy.  We reviewed recommendations for healthy diet and exercise.  She will reach out to [place] at the end of the 4-week period to discuss restarting the anastrozole.  Bone density: She has osteopenia.  We reviewed calcium vitamin D weightbearing exercises and she we will repeat bone density testing in October 2025.  Felicia Beasley will return in 1 year for continued follow-up of her history of breast cancer.  We will also touch base with her in a few weeks as noted above.

## 2023-02-14 ENCOUNTER — Other Ambulatory Visit: Payer: Self-pay | Admitting: Adult Health

## 2023-03-10 ENCOUNTER — Encounter: Payer: Self-pay | Admitting: Adult Health

## 2023-03-12 ENCOUNTER — Other Ambulatory Visit: Payer: Self-pay | Admitting: Adult Health

## 2023-06-12 ENCOUNTER — Other Ambulatory Visit: Payer: Self-pay | Admitting: Family Medicine

## 2023-06-12 DIAGNOSIS — Z1231 Encounter for screening mammogram for malignant neoplasm of breast: Secondary | ICD-10-CM

## 2023-06-16 ENCOUNTER — Ambulatory Visit
Admission: RE | Admit: 2023-06-16 | Discharge: 2023-06-16 | Disposition: A | Discharge status: Home / Self Care | Payer: BC Managed Care – PPO | Source: Ambulatory Visit | Attending: Family Medicine | Admitting: Family Medicine

## 2023-06-16 DIAGNOSIS — Z1231 Encounter for screening mammogram for malignant neoplasm of breast: Secondary | ICD-10-CM

## 2024-02-12 ENCOUNTER — Inpatient Hospital Stay: Payer: Self-pay | Attending: Adult Health | Admitting: Adult Health

## 2024-02-12 ENCOUNTER — Encounter: Payer: Self-pay | Admitting: Adult Health

## 2024-02-12 VITALS — BP 130/77 | HR 73 | Temp 98.4°F | Resp 17 | Ht 66.0 in | Wt 214.4 lb

## 2024-02-12 DIAGNOSIS — Z1721 Progesterone receptor positive status: Secondary | ICD-10-CM | POA: Diagnosis not present

## 2024-02-12 DIAGNOSIS — C50411 Malignant neoplasm of upper-outer quadrant of right female breast: Secondary | ICD-10-CM | POA: Insufficient documentation

## 2024-02-12 DIAGNOSIS — Z79811 Long term (current) use of aromatase inhibitors: Secondary | ICD-10-CM | POA: Diagnosis not present

## 2024-02-12 DIAGNOSIS — Z923 Personal history of irradiation: Secondary | ICD-10-CM | POA: Diagnosis not present

## 2024-02-12 DIAGNOSIS — Z170 Estrogen receptor positive status [ER+]: Secondary | ICD-10-CM | POA: Insufficient documentation

## 2024-02-12 NOTE — Progress Notes (Signed)
 [facility] Cancer Follow up:    Lacinda Moritz, NP [address] [place] [address]   DIAGNOSIS: Cancer Staging  Malignant neoplasm of upper-outer quadrant of right breast in female, estrogen receptor positive (HCC) Staging form: Breast, AJCC 8th Edition - Clinical stage from 08/18/2019: Stage 0 (cTis (DCIS), cN0, cM0, ER+, PR+) - Signed by Crawford Morna Pickle, NP on 08/25/2019 Stage prefix: Initial diagnosis - Pathologic stage from 09/08/2019: Stage 0 (pTis (DCIS), pN0, cM0, ER+, PR+) - Signed by Crawford Morna Pickle, NP on 09/22/2019 Stage prefix: Initial diagnosis    SUMMARY OF ONCOLOGIC HISTORY: Oncology History  Malignant neoplasm of upper-outer quadrant of right breast in female, estrogen receptor positive (HCC)  08/18/2019 Initial Diagnosis   [place]  woman status post right breast upper outer quadrant biopsy 08/18/2019 showing ductal carcinoma in situ, grade 2, estrogen and progesterone receptor positive.   08/18/2019 Cancer Staging   Staging form: Breast, AJCC 8th Edition - Clinical stage from 08/18/2019: Stage 0 (cTis (DCIS), cN0, cM0, ER+, PR+) - Signed by Crawford Morna Pickle, NP on 08/25/2019   09/08/2019 Surgery   status post right lumpectomy 09/08/2019 for ductal carcinoma in situ, grade 2, measuring 1.5 cm, with negative margins.   09/08/2019 Cancer Staging   Staging form: Breast, AJCC 8th Edition - Pathologic stage from 09/08/2019: Stage 0 (pTis (DCIS), pN0, cM0, ER+, PR+) - Signed by Crawford Morna Pickle, NP on 09/22/2019   09/09/2019 Genetic Testing   genetics testing discussed 09/09/2019--declined at that time, and continues to consider it, will let us  know if she decides to proceed     10/07/2019 - 11/22/2019 Radiation Therapy    10/07/2019 through 11/22/2019 Site Technique Total Dose (Gy) Dose per Fx (Gy) Completed Fx Beam Energies  Breast, Right: Breast_Rt_Bst 3D 10/10 2 5/5 6X, 10X  Breast, Right: Breast_Rt 3D  50.4/50.4 1.8 28/28 10X     01/03/2020 -  Anti-estrogen oral therapy   started anastrozole 01/03/2020       CURRENT THERAPY: observation  INTERVAL HISTORY: Discussed the use of AI scribe software for clinical note transcription with the patient, who gave verbal consent to proceed.  History of Present Illness Felicia Beasley is a 63 year old female with stage zero breast cancer who presents for follow-up after discontinuing anastrozole due to joint pain.  Diagnosed with stage zero breast cancer in February 2021, estrogen and progesterone receptor positive. Underwent right lumpectomy followed by adjuvant radiation therapy. Started anastrozole in July 2021, discontinued in March 2025 due to significant joint pain and decreased ability to function, including heel pain that prevented exercise for seven months. Consulted an orthopedic specialist for heel pain, described as 'scooching pain' when stepping down.  Most recent mammogram on June 16, 2023, showed no mammographic evidence of malignancy and breast density category B. Bone density test on April 24, 2022, revealed a T score of negative 2.1 in the right femur and AP spine.  Since stopping anastrozole, resumed exercise routine including gym workouts, water exercises four to five times a week, and using the steam room. Feels improvement in overall well-being. Made dietary changes, focusing on fresh fruits and vegetables, reducing red meat, primarily eating salmon and chicken.  No new breast changes except for a persistent cyst or scar tissue on the other side, which has been present before. Continues to attend yearly check-ups.     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  MEDICAL HISTORY: Past Medical History:  Diagnosis Date   Breast cancer (HCC)    Edema leg    Family history of ovarian cancer    History of recurrent UTIs    Migraine headache    Personal history of radiation therapy    Sinusitis    Venous insufficiency     SURGICAL HISTORY: Past Surgical History:  Procedure Laterality Date   ABDOMINAL HYSTERECTOMY Bilateral 04/11/2009   w/ BSO; for menorrhagia and fibroids   BREAST BIOPSY     BREAST LUMPECTOMY Right 09/08/2019   BREAST LUMPECTOMY WITH RADIOACTIVE SEED LOCALIZATION Right 09/08/2019   Procedure: RIGHT BREAST LUMPECTOMY WITH RADIOACTIVE SEED LOCALIZATION;  Surgeon: Vanderbilt Ned, MD;  Location: [facility];  Service: General;  Laterality: Right;   CESAREAN SECTION      SOCIAL HISTORY: Social History   Socioeconomic History   Marital status: Married    Spouse name: Not on file   Number of children: Not on file   Years of education: Not on file   Highest education level: Not on file  Occupational History   Not on file  Tobacco Use   Smoking status: Never   Smokeless tobacco: Never  Substance and Sexual Activity   Alcohol use: No   Drug use: No   Sexual activity: Not on file  Other Topics Concern   Not on file  Social History Narrative   Not on file   Social Drivers of Health   Financial Resource Strain: Low Risk  (09/10/2022)   Received from [facility]   Overall Financial Resource Strain (CARDIA)    Difficulty of Paying Living Expenses: Not very hard  Food Insecurity: No Food Insecurity (09/10/2022)   Received from [facility]   Hunger Vital Sign    Worried About Running Out of Food in the Last Year: Never true    Ran Out of Food in the Last Year: Never true  Transportation Needs: No Transportation Needs (09/10/2022)   Received from [facility] - Transportation    Lack of Transportation (Medical): No    Lack of Transportation (Non-Medical): No  Physical Activity: Sufficiently Active (09/10/2022)   Received from [facility]   Exercise Vital Sign    Days of Exercise per Week: 3 days    Minutes of Exercise per Session: 60 min  Stress: No Stress Concern Present (09/10/2022)   Received from [facility] - Occupational Stress Questionnaire    Feeling of Stress : Not at all  Social Connections: Socially Integrated (09/10/2022)   Received from [facility]   Social Network    How would you rate your social network (family, work, friends)?: Good participation with social networks  Intimate Partner Violence: Not At Risk (09/10/2022)   Received from [facility]   HITS    Over the last 12 months how often did your partner physically hurt you?: Never    Over the last 12 months how often did your partner insult you or talk down to you?: Never    Over the last 12 months how often did your partner threaten you with physical harm?: Never  Over the last 12 months how often did your partner scream or curse at you?: Never    FAMILY HISTORY: Family History  Problem Relation Age of Onset   Arthritis Mother    Diabetes Mother    Arthritis Father    Ovarian cancer Half-Sister 60   Emphysema Maternal Grandfather        smoker    Review of Systems  Constitutional:  Negative for appetite change, chills, fatigue, fever and unexpected weight change.  HENT:   Negative for hearing loss, lump/mass and trouble swallowing.   Eyes:  Negative for eye problems and icterus.  Respiratory:  Negative for chest tightness, cough and shortness of breath.   Cardiovascular:  Negative for chest pain, leg swelling and palpitations.  Gastrointestinal:  Negative for abdominal distention, abdominal pain, constipation, diarrhea, nausea and vomiting.  Endocrine: Negative for hot flashes.  Genitourinary:   Negative for difficulty urinating.   Musculoskeletal:  Negative for arthralgias.  Skin:  Negative for itching and rash.  Neurological:  Negative for dizziness, extremity weakness, headaches and numbness.  Hematological:  Negative for adenopathy. Does not bruise/bleed easily.  Psychiatric/Behavioral:  Negative for depression. The patient is not nervous/anxious.       PHYSICAL EXAMINATION    Vitals:   02/12/24 0839  BP: 130/77  Pulse: 73  Resp: 17  Temp: 98.4 F (36.9 C)  SpO2: 100%    Physical Exam Constitutional:      General: She is not in acute distress.    Appearance: Normal appearance. She is not toxic-appearing.  HENT:     Head: Normocephalic and atraumatic.     Mouth/Throat:     Mouth: Mucous membranes are moist.     Pharynx: Oropharynx is clear. No oropharyngeal exudate or posterior oropharyngeal erythema.  Eyes:     General: No scleral icterus. Cardiovascular:     Rate and Rhythm: Normal rate and regular rhythm.     Pulses: Normal pulses.     Heart sounds: Normal heart sounds.  Pulmonary:     Effort: Pulmonary effort is normal.     Breath sounds: Normal breath sounds.  Chest:     Comments: Right breast s/p lumpectomy and radiation, no sign of local recurrence, left breast benign Abdominal:     General: Abdomen is flat. Bowel sounds are normal. There is no distension.     Palpations: Abdomen is soft.     Tenderness: There is no abdominal tenderness.  Musculoskeletal:        General: No swelling.     Cervical back: Neck supple.  Lymphadenopathy:     Cervical: No cervical adenopathy.     Upper Body:     Right upper body: No supraclavicular or axillary adenopathy.     Left upper body: No supraclavicular or axillary adenopathy.  Skin:    General: Skin is warm and dry.     Findings: No rash.  Neurological:     General: No focal deficit present.     Mental Status: She is alert.  Psychiatric:        Mood and Affect: Mood normal.        Behavior: Behavior  normal.     LABORATORY DATA:  CBC    Component Value Date/Time   WBC 8.0 08/31/2019 1526   WBC 9.7 04/12/2009 0525   RBC 4.12 08/31/2019 1526   HGB 12.1 08/31/2019 1526   HCT 38.4 08/31/2019 1526   PLT 257 08/31/2019 1526   MCV 93.2 08/31/2019 1526  MCH 29.4 08/31/2019 1526   MCHC 31.5 08/31/2019 1526   RDW 11.9 08/31/2019 1526   LYMPHSABS 2.4 08/31/2019 1526   MONOABS 0.5 08/31/2019 1526   EOSABS 0.1 08/31/2019 1526   BASOSABS 0.0 08/31/2019 1526    CMP     Component Value Date/Time   NA 142 08/31/2019 1526   K 4.0 08/31/2019 1526   CL 106 08/31/2019 1526   CO2 28 08/31/2019 1526   GLUCOSE 92 08/31/2019 1526   BUN 13 08/31/2019 1526   CREATININE 0.87 08/31/2019 1526   CALCIUM 8.9 08/31/2019 1526   PROT 7.8 08/31/2019 1526   ALBUMIN 3.9 08/31/2019 1526   AST 14 (L) 08/31/2019 1526   ALT 13 08/31/2019 1526   ALKPHOS 71 08/31/2019 1526   BILITOT 0.2 (L) 08/31/2019 1526   GFRNONAA >60 08/31/2019 1526   GFRAA >60 08/31/2019 1526     ASSESSMENT and THERAPY PLAN:   Assessment and Plan Assessment & Plan History of stage 0 right breast cancer, ER/PR positive, status post lumpectomy and adjuvant therapy Diagnosed in February 2021, status post right lumpectomy, adjuvant radiation, and antiestrogen therapy with anastrozole. Most recent mammogram on June 16, 2023, showed no evidence of malignancy and breast density category B. Anastrozole was discontinued in March 2025 due to adverse effects. - Continue annual mammograms. - Perform annual clinical breast exam. - Encourage healthy diet and regular exercise to reduce recurrence risk.  Adverse effects of anastrozole (arthralgias and decreased mobility) Anastrozole was discontinued in March 2025 due to significant arthralgias and decreased mobility, impacting quality of life and exercise ability.  Osteopenia (T-score -2.1 right femur and AP spine) Osteopenia with a T-score of -2.1 in the right femur and AP spine as  per October 2023 bone density scan. - Recommend repeating bone density testing in 03/2024  RTC in 1 year for continued f/u.    All questions were answered. The patient knows to call the clinic with any problems, questions or concerns. We can certainly see the patient much sooner if necessary.  Total encounter time:30 minutes*in face-to-face visit time, chart review, lab review, care coordination, order entry, and documentation of the encounter time.    Morna Kendall, NP 02/12/24 8:45 AM Medical Oncology and Hematology [facility] [address] [place], [place] [address] Tel. [phone]    Fax. [phone]  *Total Encounter Time as defined by the Centers for Medicare and Medicaid Services includes, in addition to the face-to-face time of a patient visit (documented in the note above) non-face-to-face time: obtaining and reviewing outside history, ordering and reviewing medications, tests or procedures, care coordination (communications with other health care professionals or caregivers) and documentation in the medical record.

## 2024-02-13 ENCOUNTER — Ambulatory Visit: Payer: Self-pay | Admitting: Adult Health

## 2024-05-12 ENCOUNTER — Other Ambulatory Visit: Payer: Self-pay | Admitting: Family Medicine

## 2024-05-12 DIAGNOSIS — Z1231 Encounter for screening mammogram for malignant neoplasm of breast: Secondary | ICD-10-CM

## 2024-06-21 ENCOUNTER — Inpatient Hospital Stay: Admission: RE | Admit: 2024-06-21 | Discharge: 2024-06-21 | Attending: Family Medicine | Admitting: Family Medicine

## 2024-06-21 DIAGNOSIS — Z1231 Encounter for screening mammogram for malignant neoplasm of breast: Secondary | ICD-10-CM

## 2024-06-28 ENCOUNTER — Other Ambulatory Visit: Payer: Self-pay | Admitting: Family Medicine

## 2024-06-28 DIAGNOSIS — R928 Other abnormal and inconclusive findings on diagnostic imaging of breast: Secondary | ICD-10-CM

## 2024-07-07 ENCOUNTER — Ambulatory Visit
Admission: RE | Admit: 2024-07-07 | Discharge: 2024-07-07 | Disposition: A | Discharge status: Home / Self Care | Source: Ambulatory Visit | Attending: Family Medicine | Admitting: Family Medicine

## 2024-07-07 DIAGNOSIS — R928 Other abnormal and inconclusive findings on diagnostic imaging of breast: Secondary | ICD-10-CM

## 2025-02-14 ENCOUNTER — Encounter: Admitting: Adult Health
